# Patient Record
Sex: Male | Born: 1985
Health system: Southern US, Community
[De-identification: ages and names within clinical notes are randomized; demographics above are authoritative.]

## PROBLEM LIST (undated history)

## (undated) DIAGNOSIS — F988 Other specified behavioral and emotional disorders with onset usually occurring in childhood and adolescence: Secondary | ICD-10-CM

## (undated) DIAGNOSIS — B1910 Unspecified viral hepatitis B without hepatic coma: Secondary | ICD-10-CM

## (undated) DIAGNOSIS — H539 Unspecified visual disturbance: Secondary | ICD-10-CM

## (undated) DIAGNOSIS — G709 Myoneural disorder, unspecified: Secondary | ICD-10-CM

## (undated) DIAGNOSIS — G35 Multiple sclerosis: Principal | ICD-10-CM

## (undated) DIAGNOSIS — I639 Cerebral infarction, unspecified: Secondary | ICD-10-CM

## (undated) HISTORY — DX: Unspecified viral hepatitis B without hepatic coma: B19.10

## (undated) HISTORY — DX: Myoneural disorder, unspecified: G70.9

## (undated) HISTORY — DX: Unspecified visual disturbance: H53.9

## (undated) HISTORY — PX: OTHER SURGICAL HISTORY: SHX169

## (undated) HISTORY — DX: Cerebral infarction, unspecified: I63.9

## (undated) HISTORY — DX: Multiple sclerosis: G35

## (undated) HISTORY — DX: Other specified behavioral and emotional disorders with onset usually occurring in childhood and adolescence: F98.8

---
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
## 2004-10-04 ENCOUNTER — Emergency Department (HOSPITAL_COMMUNITY): Admission: EM | Admit: 2004-10-04 | Discharge: 2004-10-04 | Payer: Self-pay | Admitting: Family Medicine

## 2009-03-31 ENCOUNTER — Ambulatory Visit: Payer: Self-pay | Admitting: Family Medicine

## 2018-03-13 ENCOUNTER — Other Ambulatory Visit (HOSPITAL_COMMUNITY): Payer: Self-pay | Admitting: Emergency Medicine

## 2018-03-13 ENCOUNTER — Ambulatory Visit (HOSPITAL_COMMUNITY)
Admission: RE | Admit: 2018-03-13 | Discharge: 2018-03-13 | Disposition: A | Discharge status: Home / Self Care | Payer: 59 | Source: Ambulatory Visit | Attending: Emergency Medicine | Admitting: Emergency Medicine

## 2018-03-13 DIAGNOSIS — D496 Neoplasm of unspecified behavior of brain: Secondary | ICD-10-CM | POA: Insufficient documentation

## 2018-03-13 DIAGNOSIS — G9389 Other specified disorders of brain: Secondary | ICD-10-CM | POA: Insufficient documentation

## 2018-03-13 MED ORDER — GADOBUTROL 1 MMOL/ML IV SOLN
7.0000 mL | Freq: Once | INTRAVENOUS | Status: AC | PRN
  Administered 2018-03-13: 7 mL via INTRAVENOUS

## 2018-03-20 ENCOUNTER — Other Ambulatory Visit: Payer: Self-pay

## 2018-03-20 ENCOUNTER — Ambulatory Visit (INDEPENDENT_AMBULATORY_CARE_PROVIDER_SITE_OTHER): Payer: 59 | Admitting: Neurology

## 2018-03-20 ENCOUNTER — Encounter: Payer: Self-pay | Admitting: Neurology

## 2018-03-20 VITALS — BP 139/92 | HR 88 | Resp 16 | Ht 66.0 in | Wt 134.0 lb

## 2018-03-20 DIAGNOSIS — R9089 Other abnormal findings on diagnostic imaging of central nervous system: Secondary | ICD-10-CM

## 2018-03-20 DIAGNOSIS — R202 Paresthesia of skin: Secondary | ICD-10-CM

## 2018-03-20 DIAGNOSIS — G35 Multiple sclerosis: Secondary | ICD-10-CM | POA: Diagnosis not present

## 2018-03-20 NOTE — Progress Notes (Signed)
Reason for visit: Abnormal MRI brain  Referring physician: Dr. Tyron Carlson is a 32 y.o. male  History of present illness:  Nathaniel Carlson is a 32 year old right-handed white male with a history of decreased visual acuity involving the left eye since childhood.  The patient states that when he was around 32 years of age he had some worsening of his vision and some double vision but this gradually cleared.  In 2017, he had an event of right-sided numbness and slurred speech, and difficulty walking that lasted only about 2 hours then fully cleared.  He never had a full neurologic evaluation for this.  He hit his head at work on 12 March 2018, he underwent a CT scan of the brain which showed a left frontal abnormality.  For this reason, MRI of the brain was done with and without gadolinium enhancement.  This revealed evidence of what was felt to be demyelinating disease with a larger enhancing lesion in the left frontal lobe, a smaller enhancing lesion in the posterior frontal white matter.  The patient has mild deep white matter changes otherwise.  The patient claims that shortly after the CT scan of the head was done, he once again had a 2-hour episode of gait instability and slurring of speech, but this fully cleared.  The patient reports no residual numbness or weakness, he has not had any difficulty controlling the bowels or the bladder, he has not had any new vision changes.  He does report some problems with ADD, he feels that Adderall does help him focus better, without it he has problems with focusing and difficulty with memory.  He is sent to this office for an evaluation.  Past Medical History:  Diagnosis Date  . ADD (attention deficit disorder)   . Vision abnormalities     Past Surgical History:  Procedure Laterality Date  . orif, left arm      Family History  Problem Relation Age of Onset  . Hypertension Mother   . Asthma Mother   . Hypertension Brother      Social history:  reports that he has been smoking. He has never used smokeless tobacco. He reports that he drank alcohol. He reports that he does not use drugs.  Medications:  Prior to Admission medications   Not on File     No Known Allergies  ROS:  Out of a complete 14 system review of symptoms, the patient complains only of the following symptoms, and all other reviewed systems are negative.  Memory loss  Blood pressure (!) 139/92, pulse 88, resp. rate 16, height 5\' 6"  (1.676 m), weight 134 lb (60.8 kg).  Physical Exam  General: The patient is alert and cooperative at the time of the examination.  Eyes: Pupils are equal, round, and reactive to light. Discs are flat bilaterally.  Neck: The neck is supple, no carotid bruits are noted.  Respiratory: The respiratory examination is clear.  Cardiovascular: The cardiovascular examination reveals a regular rate and rhythm, no obvious murmurs or rubs are noted.  Skin: Extremities are without significant edema.  Neurologic Exam  Mental status: The patient is alert and oriented x 3 at the time of the examination. The patient has apparent normal recent and remote memory, with an apparently normal attention span and concentration ability.  Cranial nerves: Facial symmetry is present. There is good sensation of the face to pinprick and soft touch bilaterally. The strength of the facial muscles and the muscles to  head turning and shoulder shrug are normal bilaterally. Speech is well enunciated, no aphasia or dysarthria is noted. Extraocular movements are full. Visual fields are full. The tongue is midline, and the patient has symmetric elevation of the soft palate. No obvious hearing deficits are noted.  Motor: The motor testing reveals 5 over 5 strength of all 4 extremities. Good symmetric motor tone is noted throughout.  Sensory: Sensory testing is intact to pinprick, soft touch, vibration sensation, and position sense on all 4  extremities. No evidence of extinction is noted.  Coordination: Cerebellar testing reveals good finger-nose-finger and heel-to-shin bilaterally.  Gait and station: Gait is normal. Tandem gait is normal. Romberg is negative. No drift is seen.  Reflexes: Deep tendon reflexes are symmetric and normal bilaterally. Toes are downgoing bilaterally.   MRI brain 03/13/18:  IMPRESSION: Multiple bilateral white matter lesions. The largest lesion in the left frontal lobe has surrounding edema and incomplete ring enhancement. Lesions are all consistent with demyelinating disease. Left frontal lobe and left temporoparietal lobe show enhancement and restricted diffusion consistent with active demyelinization. Left frontal lesion consistent with tumefactive demyelinization.  * MRI scan images were reviewed online. I agree with the written report.    Assessment/Plan:  1.  Abnormal MRI brain  2.  Transient gait disturbance, slurred speech  The patient has an MRI of the brain that is consistent with demyelinating disease such as multiple sclerosis.  The patient has a ring-enhancing lesion in the left frontal lobe surrounded by edema with a small enhancing area in the left temporoparietal lobe.  The patient will be sent for blood work today, he will have MRI of the cervical spine.  The patient will be considered for initiation of a disease modifying agent for multiple sclerosis.  I would opt for a more aggressive treatment initially.  The patient clinically appears to have a normal examination.  He will follow-up in about 6 weeks.  Nathaniel Alexanders MD 03/20/2018 3:19 PM  [facility] [address] [place], [place] [address]  Phone [phone] Fax [phone]

## 2018-03-21 ENCOUNTER — Telehealth: Payer: Self-pay | Admitting: Neurology

## 2018-03-21 NOTE — Telephone Encounter (Signed)
[employer] order sent to [facility]. They obtain they auth and will reach out tot he pt to schedule.

## 2018-03-21 NOTE — Telephone Encounter (Signed)
lvm for pt to be aware . I left [facility] phone number of [phone] and to give them a call if he has not heard in the next 2-3 business days.  °

## 2018-03-24 LAB — QUANTIFERON-TB GOLD PLUS
QuantiFERON Nil Value: 0.02 IU/mL
QuantiFERON TB1 Ag Value: 0.02 IU/mL
QuantiFERON TB2 Ag Value: 0.02 IU/mL
QuantiFERON-TB Gold Plus: NEGATIVE

## 2018-03-24 LAB — ENA+DNA/DS+SJORGEN'S
ENA SSB (LA) Ab: 1.3 AI — ABNORMAL HIGH (ref 0.0–0.9)
dsDNA Ab: <1 IU/mL (ref 0–9)

## 2018-03-24 LAB — HEPATITIS C ANTIBODY

## 2018-03-24 LAB — HEPATITIS B SURFACE ANTIBODY,QUALITATIVE: Hep B Surface Ab, Qual: REACTIVE

## 2018-03-24 LAB — RPR: RPR Ser Ql: NONREACTIVE

## 2018-03-24 LAB — VARICELLA ZOSTER ANTIBODY, IGG: VARICELLA: 1207 {index} (ref >165)

## 2018-03-24 LAB — HIV ANTIBODY (ROUTINE TESTING W REFLEX): HIV Screen 4th Generation wRfx: NONREACTIVE

## 2018-03-24 LAB — ANGIOTENSIN CONVERTING ENZYME: Angio Convert Enzyme: 34 U/L (ref 14–82)

## 2018-03-24 LAB — ANA W/REFLEX: Anti Nuclear Antibody(ANA): POSITIVE — AB

## 2018-03-24 LAB — B. BURGDORFI ANTIBODIES

## 2018-03-24 LAB — SEDIMENTATION RATE: Sed Rate: 2 mm/hr (ref 0–15)

## 2018-03-26 ENCOUNTER — Telehealth: Payer: Self-pay | Admitting: *Deleted

## 2018-03-26 DIAGNOSIS — Z0289 Encounter for other administrative examinations: Secondary | ICD-10-CM

## 2018-03-26 NOTE — Telephone Encounter (Signed)
Pt [place] form on [employer].

## 2018-03-26 NOTE — Telephone Encounter (Signed)
[place].  I have some questions regarding FMLA forms and what he needs.Hilton Cork

## 2018-03-31 ENCOUNTER — Telehealth: Payer: Self-pay | Admitting: *Deleted

## 2018-03-31 NOTE — Telephone Encounter (Signed)
JCV ab collected on 03/20/18 is Indeterminate at 0.24.  Inhibition Assay is Positive/fim

## 2018-03-31 NOTE — Telephone Encounter (Signed)
Spoke with pt.  He sts. he is asking for intermittent FMLA to cover f/u appt's, mri's, etc.  Does not need continuous fmla. I will work on this/fim

## 2018-04-09 ENCOUNTER — Ambulatory Visit
Admission: RE | Admit: 2018-04-09 | Discharge: 2018-04-09 | Disposition: A | Discharge status: Home / Self Care | Payer: 59 | Source: Ambulatory Visit | Attending: Neurology | Admitting: Neurology

## 2018-04-09 DIAGNOSIS — R9089 Other abnormal findings on diagnostic imaging of central nervous system: Secondary | ICD-10-CM | POA: Diagnosis not present

## 2018-04-09 DIAGNOSIS — G35 Multiple sclerosis: Secondary | ICD-10-CM

## 2018-04-09 MED ORDER — GADOBENATE DIMEGLUMINE 529 MG/ML IV SOLN
13.0000 mL | Freq: Once | INTRAVENOUS | Status: AC | PRN
  Administered 2018-04-09: 13 mL via INTRAVENOUS

## 2018-04-13 ENCOUNTER — Telehealth: Payer: Self-pay | Admitting: Neurology

## 2018-04-13 NOTE — Telephone Encounter (Signed)
I called the patient.  MRI of the cervical spine is unremarkable.  The patient will need to go on medication, I will get him started on Gilenya.  He will need an ophthalmology evaluation, if he needs referral he will call our office.    MRI cervical 04/09/18:  IMPRESSION: This is a normal MRI of the cervical spine with and without contrast.

## 2018-04-14 ENCOUNTER — Encounter: Payer: Self-pay | Admitting: *Deleted

## 2018-04-14 ENCOUNTER — Other Ambulatory Visit: Payer: Self-pay | Admitting: *Deleted

## 2018-04-14 DIAGNOSIS — Z79899 Other long term (current) drug therapy: Secondary | ICD-10-CM

## 2018-04-14 DIAGNOSIS — G35 Multiple sclerosis: Secondary | ICD-10-CM

## 2018-04-14 NOTE — Telephone Encounter (Signed)
Patient returning your call. Thanks Hinton Dyer

## 2018-04-14 NOTE — Telephone Encounter (Signed)
I spoke with pt. this am and explained Dr. Jannifer Franklin would like to start him on Gilenya for his newly dx. MS. Before he can start Gilenya, we need notes from his most recent eye exam.  He sts. he had one in March, and ins. will only cover yearly exams. He will have his ophthalmologist fax those notes to [place] and I will see if Dr. Jannifer Franklin feels they are sufficient (need to r/o macular edema).  We also need to get an EKG and he needs to sign the Gilenya srf.  He will come into the office at 1315 today to complete these/fim

## 2018-04-14 NOTE — Telephone Encounter (Signed)
[place] and I will ask him to come in to the office asap to sign a Gilenya srf. Can do EKG at that time as well, if Dr. Jannifer Franklin would like to do his fdo in the office/fim

## 2018-04-15 ENCOUNTER — Telehealth: Payer: Self-pay | Admitting: *Deleted

## 2018-04-15 NOTE — Telephone Encounter (Signed)
PA for Gilenya 0.5mg  capsules #30/30 completed by phone with [place] today (phone# [phone]). Dx: RRMS (G35). Tried and faileds: None, although pt. is not able to self inject, so Copaxone, Glatiramer, Glatopa, Avonex, Rebif, Betaseron, Plegridy are contraindicated. PA approved for dates 04/15/18-04/16/19. PA# [id number]. Copy of approval info faxed to Gilenya Go/fim

## 2018-04-16 NOTE — Telephone Encounter (Signed)
Fax received from [facility] with PA information different than received verbally on 04/15/18.  Gilenya PA approved for dates 04/15/18-04/15/19.  Pt. ID: [id number].  Request ID: [id number]

## 2018-04-17 ENCOUNTER — Telehealth: Payer: Self-pay | Admitting: *Deleted

## 2018-04-17 NOTE — Telephone Encounter (Signed)
I called the patient, I left a message, I will call back later. 

## 2018-04-17 NOTE — Telephone Encounter (Signed)
I spoke with pt. to ask him to call [employer] to complete their welcome call; [employer] will not ship me his starter pack of Gilenya until they talk to him. Pt. had questions regarding MS pathology and Gilenya/ the immune system. We had an extended, pleasant conversation and I answered as many questions as I was able. We discussed his MRI report (brain--both active and inactive lesions, and c-spine which was clear so far).  He let me know that he continues to have numbness, mainly right sided, that waxes and wanes. More severe episode last night. We discussed risks and benefits of Gilenya (cardiac, increased risk of skin ca) and the effects on the immune system. We discussed the need for compliance (new fdo required if he misses 2 wks of med). He asked what type of MS he has and we discussed that at this time Dr. Jannifer Franklin feels he has RRMS, but that the type can change over time; every MS is individual and we learn more about the type each person has by observing their sx. over time. We discussed risks of not treating MS due to fear of side effects (new lesions with sx. depending upon where the lesions are). He expressed fears of side effects of Gilenya--sts. he has 2 children whose mother has passed away, and so he is fearful of leaving them orphans. He would like to discuss further with Dr. Jannifer Franklin as well.  Pt. works [phone] and has a lunch break at [employer].  I will see if Dr. Jannifer Franklin can call him tomorrow/fim

## 2018-04-18 NOTE — Telephone Encounter (Signed)
I called patient, left a message, I will call back later. 

## 2018-04-18 NOTE — Telephone Encounter (Signed)
I called and left another message, I will call back later.

## 2018-04-20 NOTE — Telephone Encounter (Signed)
I called the patient. I left a message again, the patient is to call us if needed.

## 2018-04-22 ENCOUNTER — Telehealth: Payer: Self-pay

## 2018-04-22 NOTE — Telephone Encounter (Signed)
RN receive call from Dr. Einar Gip office. [facility] Paganini stated they receive a urgent referral for ophthalmology. Rise Paganini stated they are a cardiology office and do not treat eye disorders.RN stated she can disregard the order it was resent to DR.Groat office. Beverly verbalized understanding.

## 2018-04-22 NOTE — Telephone Encounter (Signed)
Rn call Dr. Katy Fitch office to find if pt was schedule and did they receive the referral. They stated pt is schedule on 04/25/2018 at 1045 for new pt appt.

## 2018-04-23 ENCOUNTER — Telehealth: Payer: Self-pay | Admitting: *Deleted

## 2018-04-23 NOTE — Telephone Encounter (Signed)
LMOM to remind pt. about appt. for his Gilenya FDO on 04/28/18. He is on the schedule for 1230 but should arrive at 8am for first EKG. Please call with any questions/fim

## 2018-04-25 ENCOUNTER — Encounter: Payer: Self-pay | Admitting: Neurology

## 2018-04-28 ENCOUNTER — Encounter: Payer: Self-pay | Admitting: Neurology

## 2018-04-28 ENCOUNTER — Other Ambulatory Visit: Payer: Self-pay

## 2018-04-28 ENCOUNTER — Telehealth: Payer: Self-pay | Admitting: *Deleted

## 2018-04-28 ENCOUNTER — Ambulatory Visit (INDEPENDENT_AMBULATORY_CARE_PROVIDER_SITE_OTHER): Payer: 59 | Admitting: Neurology

## 2018-04-28 DIAGNOSIS — G35 Multiple sclerosis: Secondary | ICD-10-CM

## 2018-04-28 HISTORY — DX: Multiple sclerosis: G35

## 2018-04-28 MED ORDER — AMPHETAMINE-DEXTROAMPHETAMINE 15 MG PO TABS: 15.0000 mg | ORAL_TABLET | Freq: Every day | ORAL | 0 refills | Status: DC

## 2018-04-28 MED ORDER — AMPHETAMINE-DEXTROAMPHET ER 30 MG PO CP24: 30.0000 mg | ORAL_CAPSULE | Freq: Every morning | ORAL | 0 refills | Status: DC

## 2018-04-28 NOTE — Progress Notes (Signed)
The patient came in for a first dose observation for his Gilenya today.  EKG was done before and after the evaluation.  The patient did have a drop in the heart rate into the 60s, but he tolerated the drug quite well, no side effects were noted.  He was sent home to continue the [place] on a daily basis.

## 2018-04-28 NOTE — Telephone Encounter (Signed)
The prescriptions for Adderall were sent in.

## 2018-04-28 NOTE — Progress Notes (Signed)
0910--Pt. presented to office for scheduled Gilenya FDO. EKG done and read by Dr. Anabel Bene 0915-127/77-75-14. Hilton Cork 0920- Gilenya 0.5mg  po given./fim 7253- Dr. Jannifer Franklin with pt./fim 6644-034/74-25-95. Pt. resting quietly in recliner, on phone. No complaints./fim 1022-117/70-68-14/fim 1056-110/68-68-14. Pt. remains alert and oriented x4. Skin warm, dry, pink. No complaints voiced and no obvious distress noted/fim 1125-114/68-66-12. Pulse regular to palpation./fim 1157-123/73-66-14. Eating lunch/fim 1220-123/75-65-12. No obvious distress noted. Pt. sts. he feels fine/fim 1250-113/67-63. Pulse reg. to palpation/fim 1322-108/64-60-14./fim 1350-Up to bathroom. 115/66-93. Denies dizziness/lightheadedness. Skin warm/dry/pink/fim 1435-104/64-59-14/fim 1515-EKG done and read by Dr. Jannifer Franklin.  1520-113/74-55-14. Instructions given to take Gilenya in the am, when he will be up and more active, in order to help keep pulse up. 13 day supply of Gilenya sent home with pt. and pt. instructed that he will receive a call from the specialty pharmacy in the next few business days, to schedule delivery of Gilenya. The SP will not deliver Gilenya until they have talked to pt. Pt. verbalized understanding of same, and will watch for SP's call. Pt. instructed to contact our office if he ever gets down to a 2 wk. supply and doesn't have a delivery coming. He is aware he should contact [employer] as well, if he has not received reg. Gilenya delivery, or if he has change in insurance, and they can arrange for an interim supply. Pt. instructed that if he misses one dose of Gilenya, he should not double up the next dose--just try not to miss future diose. Pt. instructed that if he misses 2 consecutive wks. of Gilenya, FDO must be repeated. He verbalized understanding of same/fim 1535--EKG read by Dr. Jannifer Franklin, and v/o to d/c pt. home received.  Dr. Jannifer Franklin also reviewed d/c instructions, including increased risk of palpations over the  next several days up to 2 wks., with instructions that brief palpitations are ok, anything that persists must be evaluated. Pt. d/c home and is ambulatory out of the office without difficulty or distress/fim 1545 I have spoken with Stacy at [facility] and advised her that pt. completed his FDO without event today, and so Gilenya Rx. can be released to the SP.  Stacy verbalized understanding of same, sts. Gilenya rx. will be triaged to [facility]/fim

## 2018-04-28 NOTE — Telephone Encounter (Signed)
Dr. Jannifer Franklin seeing pt. for MS.  Pt. sts. he previously saw psych for ADD, wonders if Dr. Jannifer Franklin can take over Adderall rx. I confirmed with [facility] in [place] that pt. was taking Adderall ER 30mg  once daily and Adderall IR 30mg , one and 1/2 tablets daily.  Pt. sts. 45mg  of the IR Adderall was too much--sts. he took the Adderall ER 30mg  in the am, and the Adderall IR 30mg  around 2pm./fim

## 2018-04-29 ENCOUNTER — Telehealth: Payer: Self-pay | Admitting: *Deleted

## 2018-04-29 NOTE — Telephone Encounter (Signed)
I have spoken with [employer] (phone# [phone]) and received an override for both ER and IR Adderall.  Good for 1 yr. No approval #--this is just an override so pt. can get both ER and IR Adderall.  I spoke with pt. and let him know he should be able to pick meds up at the pharmacy now/fim

## 2018-05-05 ENCOUNTER — Telehealth: Payer: Self-pay | Admitting: Neurology

## 2018-05-05 NOTE — Telephone Encounter (Signed)
Received a fax from [facility] regarding [place] for Gilenya 0.5. Number provided on fax to return call [phone].  I contacted this number and spoke with a representative (Ayshea). I advised her Mr. Calabretta had his first dose observation on 04/28/2018 completed in our office.  She verbalized understanding and stated she would update information in the patient's account.  MB RN

## 2018-05-05 NOTE — Telephone Encounter (Signed)
Nathaniel Carlson with [facility] calling to get patient's 1st dose of Fingolimod HCl (GILENYA) 0.5 MG CAPS. Please call and discuss.

## 2018-05-05 NOTE — Telephone Encounter (Signed)
I attempted to reach Nathaniel Carlson at number provided [phone]) and was advised the number was not in service.

## 2018-05-05 NOTE — Telephone Encounter (Signed)
I double checked that number with Iris @ [facility] when she called and she said it was correct.Marland Kitchensorry but glad you did get a good number.

## 2018-05-12 NOTE — Addendum Note (Signed)
Encounter addended by: Dyanne Carrel on: 05/12/2018 4:04 PM  Actions taken: Imaging Exam ended, Charge Capture section accepted

## 2018-05-15 ENCOUNTER — Telehealth: Payer: Self-pay | Admitting: Neurology

## 2018-05-15 NOTE — Telephone Encounter (Signed)
This patient was seen by dr.Lundquist form [facility]. No macular abnormalities seen, the patient is on Gilenya.

## 2018-05-19 ENCOUNTER — Ambulatory Visit (INDEPENDENT_AMBULATORY_CARE_PROVIDER_SITE_OTHER): Payer: 59 | Admitting: Neurology

## 2018-05-19 ENCOUNTER — Encounter: Payer: Self-pay | Admitting: Neurology

## 2018-05-19 VITALS — BP 130/82 | HR 80 | Ht 66.0 in | Wt 137.3 lb

## 2018-05-19 DIAGNOSIS — R5382 Chronic fatigue, unspecified: Secondary | ICD-10-CM | POA: Diagnosis not present

## 2018-05-19 DIAGNOSIS — G35 Multiple sclerosis: Secondary | ICD-10-CM

## 2018-05-19 DIAGNOSIS — Z5181 Encounter for therapeutic drug level monitoring: Secondary | ICD-10-CM

## 2018-05-19 NOTE — Progress Notes (Signed)
Reason for visit: Multiple sclerosis  Nathaniel Carlson is an 33 y.o. male  History of present illness:  Nathaniel Carlson is a 33 year old right-handed white male with a history of multiple sclerosis.  The patient has just recently started Gilenya, he has been on this about 2 weeks.  He seems to be tolerating the medication fairly well.  He does report some ongoing problems with fatigue, he is on Adderall for an attention deficit disorder, but he is having more problems with focusing, and he is losing his train of thought.  The patient is having some problems with dryness of his hair and changes in his nails of his feet.  The patient otherwise is doing well, he has no obvious side effects to the Gilenya.  Past Medical History:  Diagnosis Date  . ADD (attention deficit disorder)   . Multiple sclerosis ([place]) 04/28/2018  . Vision abnormalities     Past Surgical History:  Procedure Laterality Date  . orif, left arm      Family History  Problem Relation Age of Onset  . Hypertension Mother   . Asthma Mother   . Hypertension Brother     Social history:  reports that he has been smoking. He has never used smokeless tobacco. He reports previous alcohol use. He reports that he does not use drugs.   No Known Allergies  Medications:  Prior to Admission medications   Medication Sig Start Date End Date Taking? Authorizing Provider  amphetamine-dextroamphetamine (ADDERALL XR) 30 MG 24 hr capsule Take 1 capsule (30 mg total) by mouth every morning. 04/28/18  Yes Kathrynn Ducking, MD  amphetamine-dextroamphetamine (ADDERALL) 15 MG tablet Take 1 tablet by mouth daily after lunch. 04/28/18  Yes Kathrynn Ducking, MD  Fingolimod HCl (GILENYA) 0.5 MG CAPS Take 0.5 mg by mouth daily.   Yes [provider]    ROS:  Out of a complete 14 system review of symptoms, the patient complains only of the following symptoms, and all other reviewed systems are negative.  Memory loss, headache,  numbness Agitation  Blood pressure 130/82, pulse 80, height 5\' 6"  (1.676 m), weight 137 lb 5 oz (62.3 kg).  Physical Exam  General: The patient is alert and cooperative at the time of the examination.  Skin: No significant peripheral edema is noted.   Neurologic Exam  Mental status: The patient is alert and oriented x 3 at the time of the examination. The patient has apparent normal recent and remote memory, with an apparently normal attention span and concentration ability.   Cranial nerves: Facial symmetry is present. Speech is normal, no aphasia or dysarthria is noted. Extraocular movements are full. Visual fields are full.  Motor: The patient has good strength in all 4 extremities.  Sensory examination: Soft touch sensation is symmetric on the face, arms, and legs.  Coordination: The patient has good finger-nose-finger and heel-to-shin bilaterally.  Gait and station: The patient has a normal gait. Tandem gait is very minimally unsteady. Romberg is negative. No drift is seen.  Reflexes: Deep tendon reflexes are symmetric.   Assessment/Plan:  1.  Multiple sclerosis  2.  Attention deficit disorder  The patient is having increased problems with fatigue, further blood work will be done, we will check a thyroid profile.  The patient has had a JC antibody that was positive with a very low titer, 0.24.  The patient will follow-up in 5 months.  Jill Alexanders MD 05/19/2018 2:28 PM  [facility]  [address] [place], [place] [address]  Phone [phone] Fax [phone]

## 2018-05-20 LAB — COMPREHENSIVE METABOLIC PANEL
ALT: 15 IU/L (ref 0–44)
AST: 18 IU/L (ref 0–40)
Albumin/Globulin Ratio: 2.1 (ref 1.2–2.2)
Albumin: 4.5 g/dL (ref 3.5–5.5)
Alkaline Phosphatase: 63 IU/L (ref 39–117)
BUN/Creatinine Ratio: 9 (ref 9–20)
BUN: 9 mg/dL (ref 6–20)
Bilirubin Total: 0.8 mg/dL (ref 0.0–1.2)
CO2: 21 mmol/L (ref 20–29)
Calcium: 9.8 mg/dL (ref 8.7–10.2)
Chloride: 104 mmol/L (ref 96–106)
Creatinine, Ser: 1 mg/dL (ref 0.76–1.27)
GFR calc Af Amer: 113 mL/min/{1.73_m2} (ref >59)
GFR calc non Af Amer: 98 mL/min/{1.73_m2} (ref >59)
Globulin, Total: 2.1 g/dL (ref 1.5–4.5)
Glucose: 88 mg/dL (ref 65–99)
POTASSIUM: 4 mmol/L (ref 3.5–5.2)
Sodium: 140 mmol/L (ref 134–144)
Total Protein: 6.6 g/dL (ref 6.0–8.5)

## 2018-05-20 LAB — CBC WITH DIFFERENTIAL/PLATELET
Basophils Absolute: 0 10*3/uL (ref 0.0–0.2)
Basos: 0 %
EOS (ABSOLUTE): 0.1 10*3/uL (ref 0.0–0.4)
Eos: 2 %
Hematocrit: 43.1 % (ref 37.5–51.0)
Hemoglobin: 15.3 g/dL (ref 13.0–17.7)
Immature Grans (Abs): 0 10*3/uL (ref 0.0–0.1)
Immature Granulocytes: 0 %
Lymphocytes Absolute: 0.4 10*3/uL — ABNORMAL LOW (ref 0.7–3.1)
Lymphs: 8 %
MCH: 31.7 pg (ref 26.6–33.0)
MCHC: 35.5 g/dL (ref 31.5–35.7)
MCV: 89 fL (ref 79–97)
Monocytes Absolute: 0.5 10*3/uL (ref 0.1–0.9)
Monocytes: 9 %
Neutrophils Absolute: 4 10*3/uL (ref 1.4–7.0)
Neutrophils: 81 %
PLATELETS: 241 10*3/uL (ref 150–450)
RBC: 4.83 x10E6/uL (ref 4.14–5.80)
RDW: 12.9 % (ref 11.6–15.4)
WBC: 5 10*3/uL (ref 3.4–10.8)

## 2018-05-20 LAB — TSH: TSH: 0.637 u[IU]/mL (ref 0.450–4.500)

## 2018-06-17 ENCOUNTER — Other Ambulatory Visit: Payer: Self-pay | Admitting: Neurology

## 2018-06-17 MED ORDER — AMPHETAMINE-DEXTROAMPHET ER 30 MG PO CP24: 30.0000 mg | ORAL_CAPSULE | Freq: Every morning | ORAL | 0 refills | Status: DC

## 2018-06-17 MED ORDER — AMPHETAMINE-DEXTROAMPHETAMINE 15 MG PO TABS: 15.0000 mg | ORAL_TABLET | Freq: Every day | ORAL | 0 refills | Status: DC

## 2018-07-24 ENCOUNTER — Other Ambulatory Visit: Payer: Self-pay | Admitting: Neurology

## 2018-07-24 MED ORDER — AMPHETAMINE-DEXTROAMPHETAMINE 15 MG PO TABS: 15.0000 mg | ORAL_TABLET | Freq: Every day | ORAL | 0 refills | Status: DC

## 2018-07-24 MED ORDER — AMPHETAMINE-DEXTROAMPHET ER 30 MG PO CP24: 30.0000 mg | ORAL_CAPSULE | Freq: Every morning | ORAL | 0 refills | Status: DC

## 2018-08-26 MED ORDER — AMPHETAMINE-DEXTROAMPHETAMINE 15 MG PO TABS: 15.0000 mg | ORAL_TABLET | Freq: Every day | ORAL | 0 refills | Status: DC

## 2018-08-26 MED ORDER — AMPHETAMINE-DEXTROAMPHET ER 30 MG PO CP24: 30.0000 mg | ORAL_CAPSULE | Freq: Every morning | ORAL | 0 refills | Status: DC

## 2018-09-25 ENCOUNTER — Other Ambulatory Visit: Payer: Self-pay | Admitting: Neurology

## 2018-09-25 MED ORDER — AMPHETAMINE-DEXTROAMPHET ER 30 MG PO CP24: 30.0000 mg | ORAL_CAPSULE | Freq: Every morning | ORAL | 0 refills | Status: DC

## 2018-09-25 MED ORDER — AMPHETAMINE-DEXTROAMPHETAMINE 15 MG PO TABS: 15.0000 mg | ORAL_TABLET | Freq: Every day | ORAL | 0 refills | Status: DC

## 2018-10-15 ENCOUNTER — Telehealth: Payer: Self-pay | Admitting: *Deleted

## 2018-10-15 NOTE — Telephone Encounter (Signed)
Due to current COVID 19 pandemic, our office is severely reducing in office visits until further notice, in order to minimize the risk to our patients and healthcare providers. Unable to get in contact with the patient to convert their office visit with Judson Roch 10/20/2018 into a doxy.me visit. I left a voicemail asking the patient to return my call. Office number was provided.     If patient calls back please convert their office visit into a doxy.me visit.

## 2018-10-20 ENCOUNTER — Ambulatory Visit: Payer: 59 | Admitting: Neurology

## 2018-10-28 ENCOUNTER — Other Ambulatory Visit: Payer: Self-pay

## 2018-10-29 MED ORDER — AMPHETAMINE-DEXTROAMPHETAMINE 15 MG PO TABS: 15.0000 mg | ORAL_TABLET | Freq: Every day | ORAL | 0 refills | Status: DC

## 2018-10-29 MED ORDER — AMPHETAMINE-DEXTROAMPHET ER 30 MG PO CP24: 30.0000 mg | ORAL_CAPSULE | Freq: Every morning | ORAL | 0 refills | Status: DC

## 2018-11-26 ENCOUNTER — Other Ambulatory Visit: Payer: Self-pay

## 2018-11-26 MED ORDER — AMPHETAMINE-DEXTROAMPHET ER 30 MG PO CP24: 30.0000 mg | ORAL_CAPSULE | Freq: Every morning | ORAL | 0 refills | Status: DC

## 2018-11-26 MED ORDER — AMPHETAMINE-DEXTROAMPHETAMINE 15 MG PO TABS: 15.0000 mg | ORAL_TABLET | Freq: Every day | ORAL | 0 refills | Status: DC

## 2018-12-24 ENCOUNTER — Other Ambulatory Visit: Payer: Self-pay

## 2018-12-24 MED ORDER — AMPHETAMINE-DEXTROAMPHETAMINE 15 MG PO TABS: 15.0000 mg | ORAL_TABLET | Freq: Every day | ORAL | 0 refills | Status: DC

## 2018-12-24 MED ORDER — AMPHETAMINE-DEXTROAMPHET ER 30 MG PO CP24: 30.0000 mg | ORAL_CAPSULE | Freq: Every morning | ORAL | 0 refills | Status: DC

## 2019-02-06 ENCOUNTER — Other Ambulatory Visit: Payer: Self-pay

## 2019-02-09 NOTE — Telephone Encounter (Signed)
[place] Database Verified LR: 12-30-2018 Qty: 30 Pending appointment: No pending appt

## 2019-02-10 MED ORDER — AMPHETAMINE-DEXTROAMPHETAMINE 15 MG PO TABS: 15.0000 mg | ORAL_TABLET | Freq: Every day | ORAL | 0 refills | Status: DC

## 2019-02-10 MED ORDER — AMPHETAMINE-DEXTROAMPHET ER 30 MG PO CP24: 30.0000 mg | ORAL_CAPSULE | Freq: Every morning | ORAL | 0 refills | Status: DC

## 2019-03-02 ENCOUNTER — Other Ambulatory Visit: Payer: Self-pay

## 2019-03-02 NOTE — Telephone Encounter (Signed)
These prescriptions are not due until 10 March 2019.

## 2019-03-05 ENCOUNTER — Other Ambulatory Visit: Payer: Self-pay

## 2019-03-09 NOTE — Telephone Encounter (Signed)
[place] Database Verified LR: 02-10-2019 Qty: 30 Pending appointment: No pending appt

## 2019-03-10 MED ORDER — AMPHETAMINE-DEXTROAMPHET ER 30 MG PO CP24: 30.0000 mg | ORAL_CAPSULE | Freq: Every morning | ORAL | 0 refills | Status: DC

## 2019-03-10 MED ORDER — AMPHETAMINE-DEXTROAMPHETAMINE 15 MG PO TABS: 15.0000 mg | ORAL_TABLET | Freq: Every day | ORAL | 0 refills | Status: DC

## 2019-03-10 NOTE — Telephone Encounter (Signed)
The prescriptions were sent in today.

## 2019-03-16 ENCOUNTER — Other Ambulatory Visit: Payer: Self-pay | Admitting: Neurology

## 2019-04-16 ENCOUNTER — Encounter: Payer: Self-pay | Admitting: *Deleted

## 2019-04-16 ENCOUNTER — Other Ambulatory Visit: Payer: Self-pay

## 2019-04-16 MED ORDER — AMPHETAMINE-DEXTROAMPHETAMINE 15 MG PO TABS: 15.0000 mg | ORAL_TABLET | Freq: Every day | ORAL | 0 refills | Status: DC

## 2019-04-16 MED ORDER — AMPHETAMINE-DEXTROAMPHET ER 30 MG PO CP24: 30.0000 mg | ORAL_CAPSULE | Freq: Every morning | ORAL | 0 refills | Status: DC

## 2019-04-21 ENCOUNTER — Telehealth: Payer: Self-pay | Admitting: Neurology

## 2019-04-21 NOTE — Telephone Encounter (Signed)
PA received from [facility] via fax. I have completed the information and I have place fax in Dr. Jannifer Franklin office to sign. Once completed we will fax.

## 2019-04-21 NOTE — Telephone Encounter (Signed)
Pt called and LVM stating that he called to fill his GILENYA 0.5 MG CAPS and the pharmacy is informing him that the office needs to get a P.A. and call them with it and also with insurance information that they are needing. Please advise.

## 2019-04-21 NOTE — Telephone Encounter (Signed)
PA for Gilenya has been faxed to # [phone], confirmation has been received. Pt notified via my chart PA has been submitted.

## 2019-04-23 NOTE — Telephone Encounter (Signed)
PA for Gilenya 0.5 mg has been approved. Effective 04/22/2019- 04/21/2020. Request ID [id number]. Pt notified of approval via my chart.

## 2019-05-20 ENCOUNTER — Other Ambulatory Visit: Payer: Self-pay | Admitting: Neurology

## 2019-05-21 ENCOUNTER — Encounter: Payer: Self-pay | Admitting: Neurology

## 2019-05-21 MED ORDER — AMPHETAMINE-DEXTROAMPHETAMINE 15 MG PO TABS: 15.0000 mg | ORAL_TABLET | Freq: Every day | ORAL | 0 refills | Status: DC

## 2019-05-21 MED ORDER — AMPHETAMINE-DEXTROAMPHET ER 30 MG PO CP24: 30.0000 mg | ORAL_CAPSULE | Freq: Every morning | ORAL | 0 refills | Status: DC

## 2019-06-23 ENCOUNTER — Other Ambulatory Visit: Payer: Self-pay | Admitting: Neurology

## 2019-06-23 MED ORDER — AMPHETAMINE-DEXTROAMPHET ER 30 MG PO CP24: 30.0000 mg | ORAL_CAPSULE | Freq: Every morning | ORAL | 0 refills | Status: DC

## 2019-06-23 MED ORDER — AMPHETAMINE-DEXTROAMPHETAMINE 15 MG PO TABS: 15.0000 mg | ORAL_TABLET | Freq: Every day | ORAL | 0 refills | Status: DC

## 2019-07-13 ENCOUNTER — Other Ambulatory Visit: Payer: Self-pay | Admitting: Neurology

## 2019-07-13 MED ORDER — AMPHETAMINE-DEXTROAMPHETAMINE 15 MG PO TABS: 15.0000 mg | ORAL_TABLET | Freq: Every day | ORAL | 0 refills | Status: DC

## 2019-07-13 MED ORDER — AMPHETAMINE-DEXTROAMPHET ER 30 MG PO CP24: 30.0000 mg | ORAL_CAPSULE | Freq: Every morning | ORAL | 0 refills | Status: DC

## 2019-09-21 ENCOUNTER — Other Ambulatory Visit: Payer: Self-pay | Admitting: Neurology

## 2019-10-01 ENCOUNTER — Other Ambulatory Visit: Payer: Self-pay | Admitting: Neurology

## 2019-10-01 MED ORDER — AMPHETAMINE-DEXTROAMPHETAMINE 15 MG PO TABS: 15.0000 mg | ORAL_TABLET | Freq: Every day | ORAL | 0 refills | Status: DC

## 2019-10-01 MED ORDER — AMPHETAMINE-DEXTROAMPHET ER 30 MG PO CP24: 30.0000 mg | ORAL_CAPSULE | Freq: Every morning | ORAL | 0 refills | Status: DC

## 2019-10-13 IMAGING — MR MR HEAD WO/W CM
12 of 16 series · 25 of 48 positions shown · IV contrast (agent unspecified)
Comparison: None.

CLINICAL DATA: Recent minor head injury. Abnormal CT. Possible
brain tumor.

EXAM:
MRI HEAD WITHOUT AND WITH CONTRAST
TECHNIQUE: Multiplanar, multiecho pulse sequences of the brain and surrounding
structures were obtained without and with intravenous contrast.
CONTRAST:  7 mL Gadovist IV

[Series 3: DWI · axial · 3.0mm · 1.09mm/px · z∈[-61,+74]mm · 3 of 94 slices shown (1 of 4)]
[im 1/94]
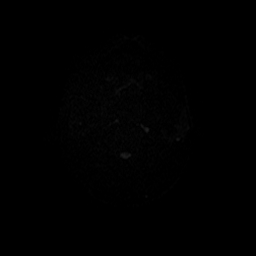
[im 47/94]
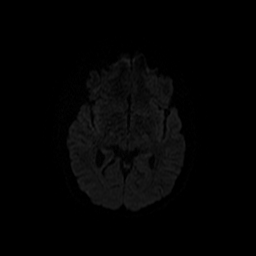
[im 94/94]
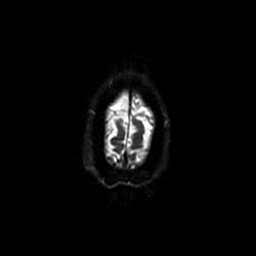

[Series 4: FLAIR · axial · 3.0mm · 0.47mm/px · 1 of 24 slices shown (1 of 2)]
[im 1/24]
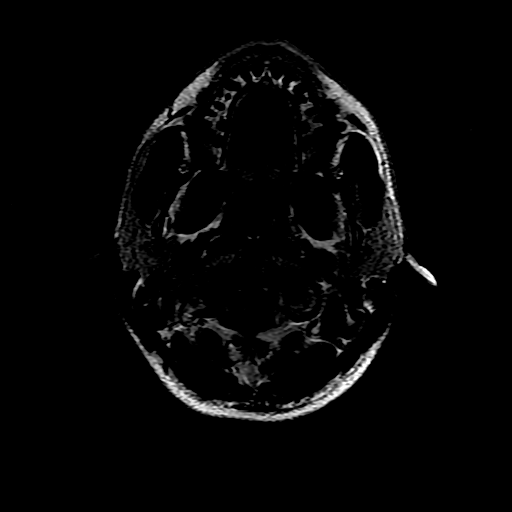

[Series 5: T2 · axial · 5.0mm · 0.47mm/px · 1 of 24 slices shown]
[im 1/24]
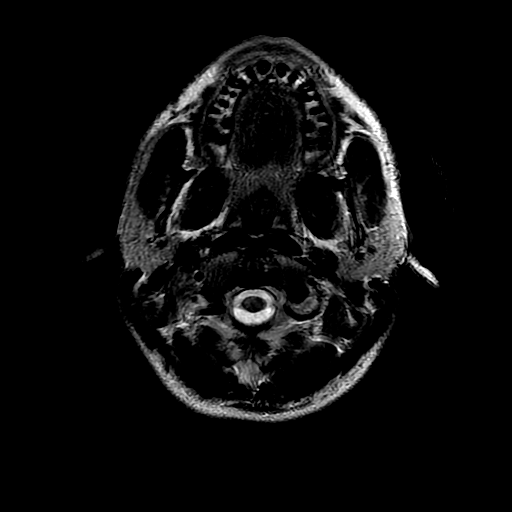

[Series 6: DWI · coronal · 5.0mm · 1.09mm/px · 3 of 74 slices shown (2 of 4)]
[im 1/74]
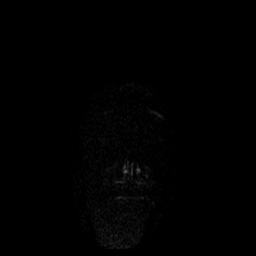
[im 37/74]
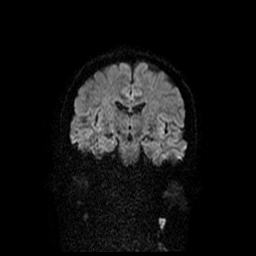
[im 74/74]
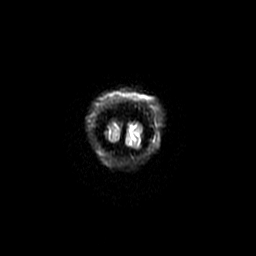

[Series 7: ax mpgr · axial · 5.0mm · 0.47mm/px · 1 of 24 slices shown]
[im 1/24]
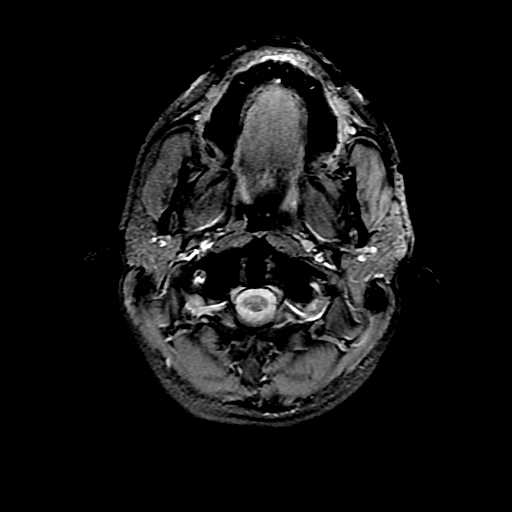

[Series 8: T1 · sagittal · 5.0mm · 0.47mm/px · 1 of 23 slices shown]
[im 1/23]
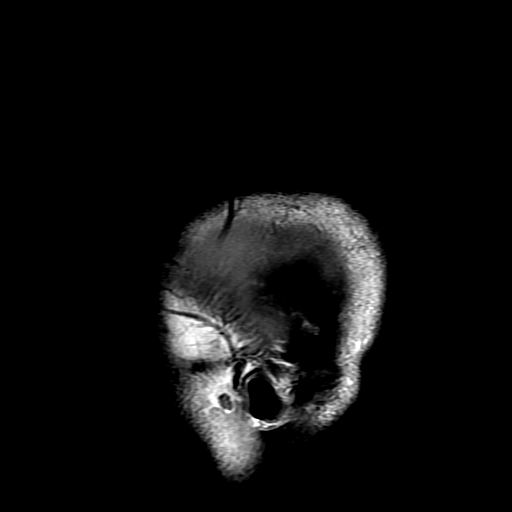

[Series 10: T2 post-contrast · coronal · 5.0mm · 0.39mm/px · 1 of 28 slices shown]
[im 1/28]
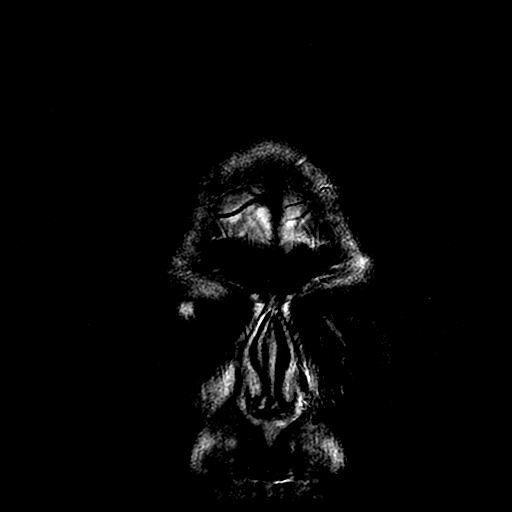

[Series 12: T1 post-contrast · coronal · 5.0mm · 0.39mm/px · 1 of 28 slices shown (1 of 2)]
[im 1/28]
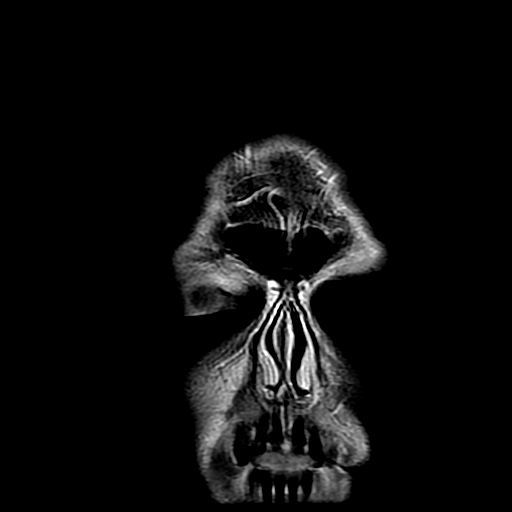

[Series 13: T1 post-contrast · sagittal · 5.0mm · 0.47mm/px · 1 of 23 slices shown (2 of 2)]
[im 1/23]
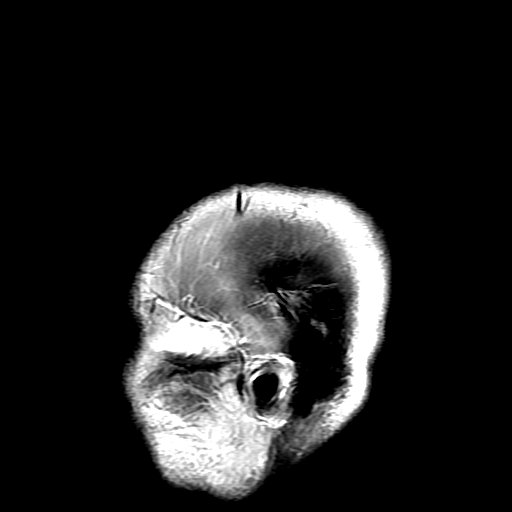

[Series 14: FLAIR · sagittal · 1.2mm · 0.49mm/px · 9 of 240 slices shown (2 of 2)]
[im 1/240]
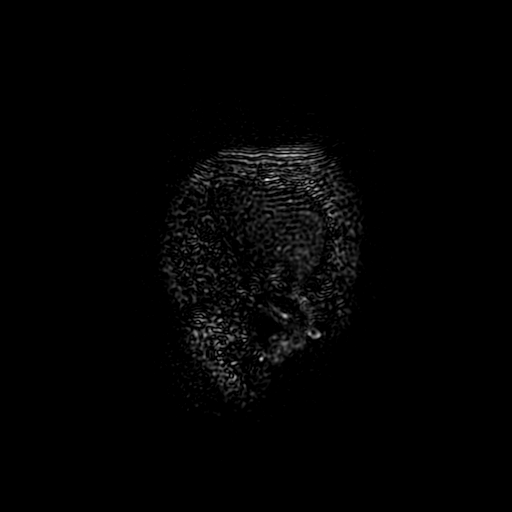
[im 30/240]
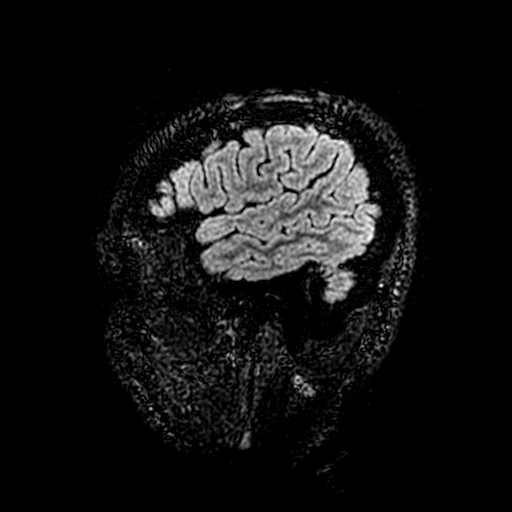
[im 60/240]
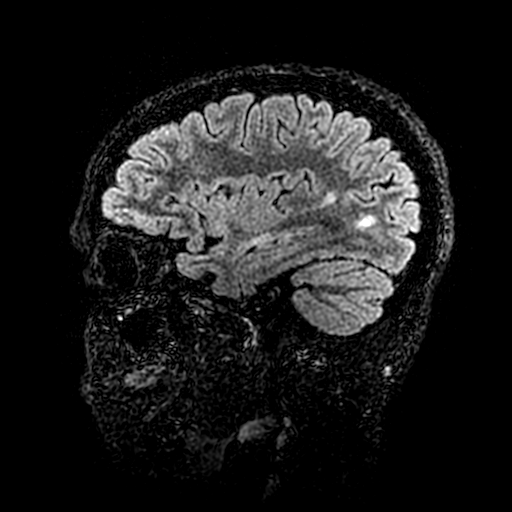
[im 90/240]
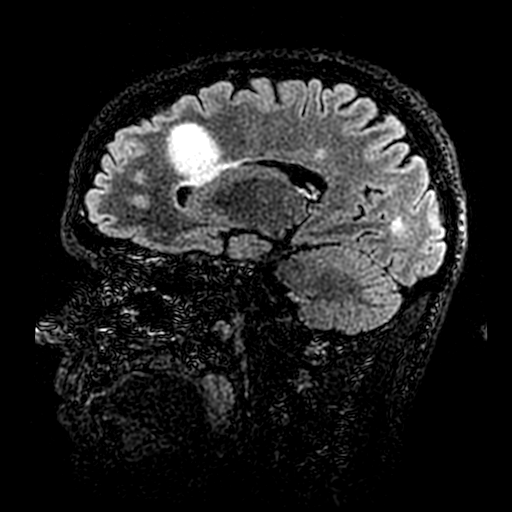
[im 120/240]
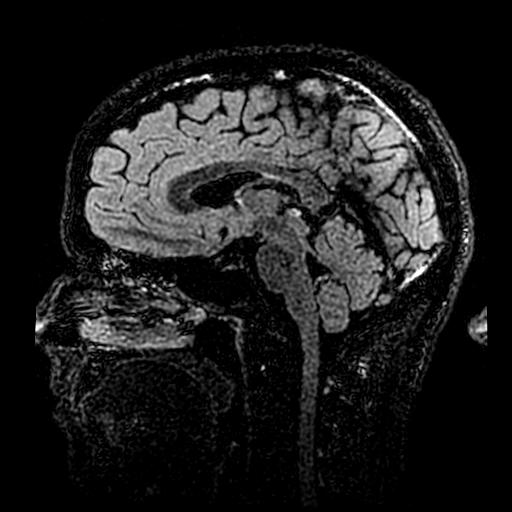
[im 150/240]
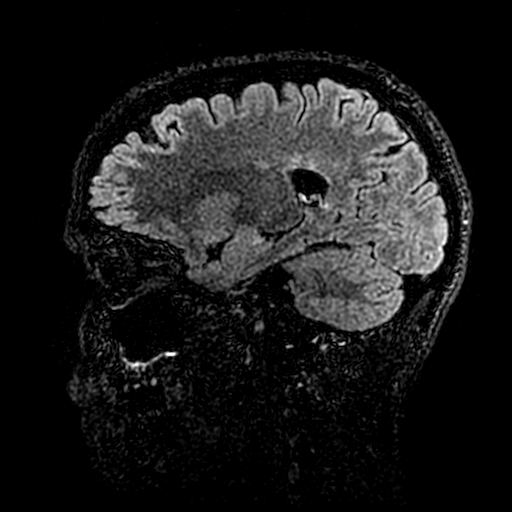
[im 180/240]
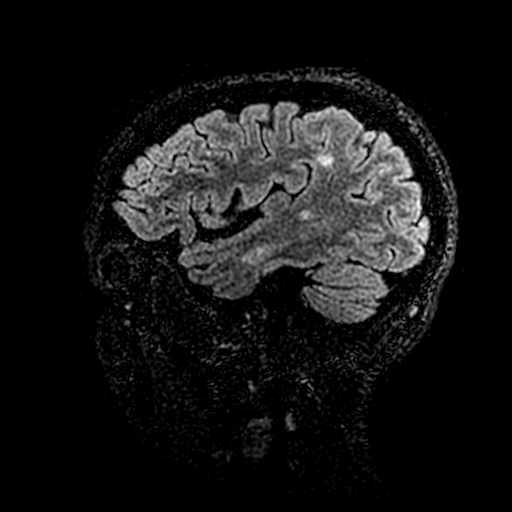
[im 210/240]
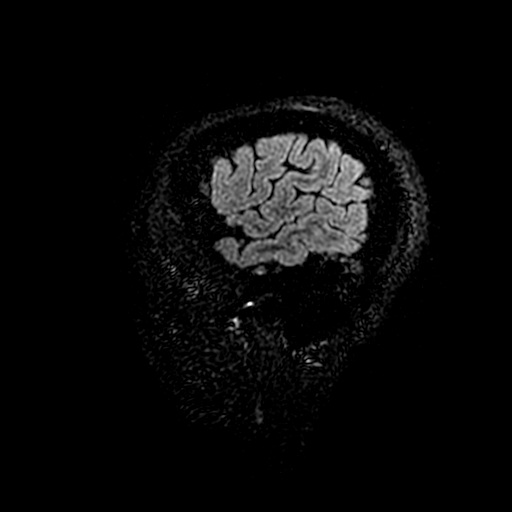
[im 240/240]
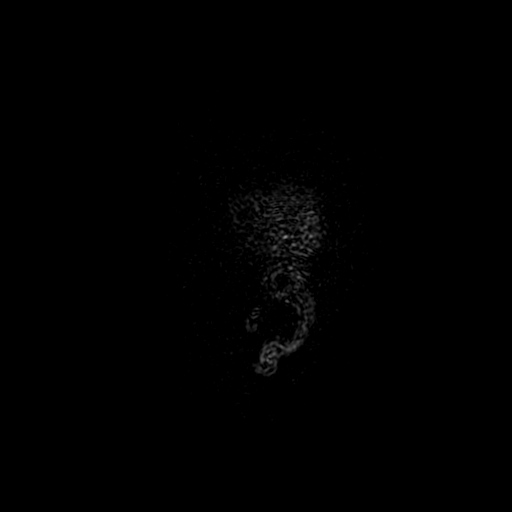

[Series 300: DWI · axial · 3.0mm · 1.09mm/px · z∈[-61,+74]mm · 2 of 47 slices shown (3 of 4)]
[im 1/47]
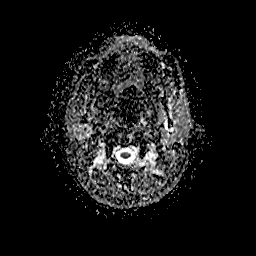
[im 47/47]
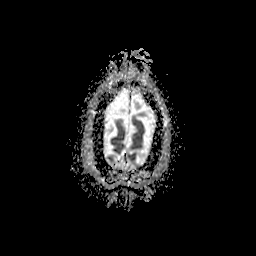

[Series 600: DWI · coronal · 5.0mm · 1.09mm/px · 1 of 37 slices shown (4 of 4)]
[im 1/37]
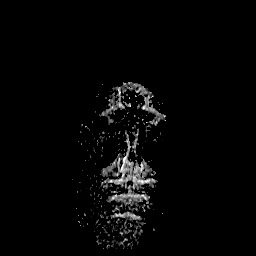

[25 of 48 positions shown; findings below may reference images not displayed]

FINDINGS: Brain: Multiple white matter lesions are present. The largest lesion
in the left frontal lobe has central hyperintensity and surrounding
edema. The central portion measures 10 mm and has incomplete rim
enhancement. Additional lesions in the periventricular white matter
are perpendicular to the corpus callosum and consistent with
demyelinating disease. Slight enhancement and restricted diffusion
of white matter lesion in the left temporoparietal lobe. Moderate
plaque burden. Brainstem and posterior fossa normal

Ventricle size normal. No midline shift. No acute infarct or
hemorrhage.

Vascular: Normal arterial flow void

Skull and upper cervical spine: Negative

Sinuses/Orbits: Negative

Other: None
IMPRESSION: Multiple bilateral white matter lesions. The largest lesion in the
left frontal lobe has surrounding edema and incomplete ring
enhancement. Lesions are all consistent with demyelinating disease.
Left frontal lobe and left temporoparietal lobe show enhancement and
restricted diffusion consistent with active demyelinization. Left
frontal lesion consistent with tumefactive demyelinization.

These results were called by telephone at the time of interpretation
on 03/13/2018 at [DATE] to Dr. NOMASIBULELE MOATSHE , who verbally
acknowledged these results.

## 2019-11-05 ENCOUNTER — Other Ambulatory Visit: Payer: Self-pay | Admitting: Neurology

## 2019-11-05 MED ORDER — AMPHETAMINE-DEXTROAMPHETAMINE 15 MG PO TABS: 15.0000 mg | ORAL_TABLET | Freq: Every day | ORAL | 0 refills | Status: DC

## 2019-11-05 MED ORDER — AMPHETAMINE-DEXTROAMPHET ER 30 MG PO CP24: 30.0000 mg | ORAL_CAPSULE | Freq: Every morning | ORAL | 0 refills | Status: DC

## 2021-11-29 DIAGNOSIS — F900 Attention-deficit hyperactivity disorder, predominantly inattentive type: Secondary | ICD-10-CM | POA: Diagnosis not present

## 2022-01-10 DIAGNOSIS — F900 Attention-deficit hyperactivity disorder, predominantly inattentive type: Secondary | ICD-10-CM | POA: Diagnosis not present

## 2022-04-03 DIAGNOSIS — S81812A Laceration without foreign body, left lower leg, initial encounter: Secondary | ICD-10-CM | POA: Diagnosis not present

## 2022-04-17 DIAGNOSIS — S81812D Laceration without foreign body, left lower leg, subsequent encounter: Secondary | ICD-10-CM | POA: Diagnosis not present

## 2022-04-17 DIAGNOSIS — M79662 Pain in left lower leg: Secondary | ICD-10-CM | POA: Diagnosis not present

## 2022-07-06 DIAGNOSIS — F900 Attention-deficit hyperactivity disorder, predominantly inattentive type: Secondary | ICD-10-CM | POA: Diagnosis not present

## 2022-08-21 DIAGNOSIS — G35 Multiple sclerosis: Secondary | ICD-10-CM | POA: Diagnosis not present

## 2022-08-28 ENCOUNTER — Telehealth: Payer: Self-pay

## 2022-08-28 ENCOUNTER — Encounter: Payer: Self-pay | Admitting: Neurology

## 2022-08-28 ENCOUNTER — Other Ambulatory Visit: Payer: Self-pay | Admitting: Family Medicine

## 2022-08-28 DIAGNOSIS — G35 Multiple sclerosis: Secondary | ICD-10-CM

## 2022-08-28 NOTE — Telephone Encounter (Signed)
Care everywhere search 

## 2022-09-07 ENCOUNTER — Ambulatory Visit
Admission: RE | Admit: 2022-09-07 | Discharge: 2022-09-07 | Disposition: A | Discharge status: Home / Self Care | Payer: BC Managed Care – PPO | Source: Ambulatory Visit | Attending: Family Medicine | Admitting: Family Medicine

## 2022-09-07 DIAGNOSIS — Z0389 Encounter for observation for other suspected diseases and conditions ruled out: Secondary | ICD-10-CM | POA: Diagnosis not present

## 2022-09-07 DIAGNOSIS — G35 Multiple sclerosis: Secondary | ICD-10-CM

## 2022-09-07 MED ORDER — GADOPICLENOL 0.5 MMOL/ML IV SOLN
6.0000 mL | Freq: Once | INTRAVENOUS | Status: AC | PRN
  Administered 2022-09-07: 6 mL via INTRAVENOUS

## 2022-09-24 NOTE — Progress Notes (Unsigned)
[facility]  PATIENT: Nathaniel Carlson DOB: [birth year]  REFERRING DOCTOR OR PCP: Lucile Crater, NP SOURCE: Patient, notes from primary care, notes from neurology, imaging and lab reports, MRI images personally reviewed.  _________________________________   HISTORICAL  CHIEF COMPLAINT:  Chief Complaint  Patient presents with   New Patient (Initial Visit)    Pt in room 11. New patient former Dr. Anne Hahn 2020 MS. Pt had MS relapse a couple weeks ago. Pt reports he was working on sisters car and speech became slurred. Pt said has extreme fatigue, strength in hands are weak, pt said forgetting things more often. Pt said he doesn't feel Adderall  is helping him.    HISTORY OF PRESENT ILLNESS:  I had the pleasure to see your patient, Nathaniel Carlson, at the MS Center at the [facility] at [facility] for a neurologic consultation regarding his multiple sclerosis.  He is a 37 year old man who was diagnosed with MS in 2019 who presented to the emergency room March 12, 2018 with a head injury.  A CT scan of the head showed a left frontal abnormal focus and he had an MRI of the brain with and without contrast.  It was consistent with multiple sclerosis.  The left frontal lobe focus had a tumefactive enhancing appearance.  He was referred to Dr. Johnnette Gourd Who saw him shortly after the emergency room visit.  He was placed on Gilenya.  He was lost to neurologic follow-up after 2020.   He took Gilenya for about one year but was having numbness so he stopped.  In retrospect, he has some other symptoms before his diagnosis in 2019..  At age 26 he had diplopia and visual changes that gradually improved over a few weeks.  In 2017 he had an episode of right-sided numbness and slurred speech with ataxia that lasted only a few hours.  He was doing well with his medication until early April 2024.   He was working under a car when he had the onset of slurred speech, hand weakness  and clumsiness.   Symptoms were significant and stable for the first 4 or 5 days and then he improved a little but not to baseline.   His fatigue is much worse since then and has not improved.   He had a repeat MRI of the brain on 09/07/2022 that showed significant progression with many more T2/FLAIR hyperintense foci in the hemispheres compared to the 2020 MRI.  1 large focus in the right frontal lobe enhanced.  2 other smaller enhancing foci are noted in the right frontal lobe also enhanced.  Currently, gait is a little off balanced and this worsens as the day goes on.  He is stumbling at the end of the day.    He feels weak in his hands throughout the day and his legs feel weaker late in the day.    He has mild left arm numbness but no painful dysesthesias.  Bladder function is fine but he has had ED since the onset of symptoms 08/2022   Vision is stable.  He wears glasses.  He has extreme fatigue that started with the other symptoms April 2024.   He is sleeping well   He has noted auditory processing and reduced word finding difficulty, much worse since April though present to a mild degree before that date.   Mood is doing well   Imaging: MRI of the brain 03/13/2018 showed some scattered T2/FLAIR hypertense foci in the periventricular, juxtacortical  and deep white matter of the cerebral hemispheres.  1 focus in the left frontal lobe is acute with enhancement (tumefactive appearance).  Another small focus that enhanced results are noted.  MRI of the cervical spine 04/09/2018 showed a possible subtle focus at the cervicomedullary junction.  The spinal cord is otherwise normal.  No significant degenerative changes.  MRI of the brain 09/07/2022 shows multiple T2/FLAIR hyperintense foci in the periventricular, juxtacortical and deep white matter.  One of the foci in the right frontal lobe enhances after contrast administration consistent with acute demyelination.  There is a smaller focus nearby in the  right frontal lobe that also has subtle enhancement.      REVIEW OF SYSTEMS: Constitutional: No fevers, chills, sweats, or change in appetite Eyes: No visual changes, double vision, eye pain Ear, nose and throat: No hearing loss, ear pain, nasal congestion, sore throat Cardiovascular: No chest pain, palpitations Respiratory:  No shortness of breath at rest or with exertion.   No wheezes GastrointestinaI: No nausea, vomiting, diarrhea, abdominal pain, fecal incontinence Genitourinary:  No dysuria, urinary retention or frequency.  No nocturia. Musculoskeletal:  No neck pain, back pain Integumentary: No rash, pruritus, skin lesions Neurological: as above Psychiatric: No depression at this time.  No anxiety Endocrine: No palpitations, diaphoresis, change in appetite, change in weigh or increased thirst Hematologic/Lymphatic:  No anemia, purpura, petechiae. Allergic/Immunologic: No itchy/runny eyes, nasal congestion, recent allergic reactions, rashes  ALLERGIES: Allergies  Allergen Reactions   Morphine     Pt said he grandfather passed away from morphine at the hospital. Pt has not never had morphine.     HOME MEDICATIONS:  Current Outpatient Medications:    amphetamine-dextroamphetamine (ADDERALL XR) 30 MG 24 hr capsule, Take 1 capsule (30 mg total) by mouth every morning., Disp: 30 capsule, Rfl: 0   amphetamine-dextroamphetamine (ADDERALL) 15 MG tablet, Take 1 tablet by mouth daily after lunch. (Patient taking differently: Take 30 mg by mouth daily after lunch.), Disp: 30 tablet, Rfl: 0   predniSONE (DELTASONE) 50 MG tablet, 12 pills (600 mg) po qd x 3 days (high dose steroid for MS exacerbation), Disp: 36 tablet, Rfl: 0   sildenafil (VIAGRA) 100 MG tablet, Take 1/2 to 1 pill po qd prn., Disp: 30 tablet, Rfl: 3   GILENYA 0.5 MG CAPS, TAKE 1 CAPSULE ONCE A DAY (Patient not taking: Reported on 09/27/2022), Disp: 30 capsule, Rfl: 3  PAST MEDICAL HISTORY: Past Medical History:  Diagnosis  Date   ADD (attention deficit disorder)    Multiple sclerosis (HCC) 04/28/2018   Vision abnormalities     PAST SURGICAL HISTORY: Past Surgical History:  Procedure Laterality Date   orif, left arm      FAMILY HISTORY: Family History  Problem Relation Age of Onset   Hypertension Mother    Asthma Mother    Hypertension Brother     SOCIAL HISTORY: Social History   Socioeconomic History   Marital status: Unknown    Spouse name: Not on file   Number of children: 3   Years of education: Not on file   Highest education level: Not on file  Occupational History   Not on file  Tobacco Use   Smoking status: Every Day    Years: 17    Types: Cigarettes   Smokeless tobacco: Never   Tobacco comments:    4 cigarettes per day  Vaping Use   Vaping Use: Never used  Substance and Sexual Activity   Alcohol use: Not Currently  Drug use: Never   Sexual activity: Not on file  Other Topics Concern   Not on file  Social History Narrative   ** Merged History Encounter ** ** Merged History Encounter **          Right handed    Wear glasses    Drinks one soda per day, drinks sweet    Social Determinants of Health   Financial Resource Strain: Not on file  Food Insecurity: Not on file  Transportation Needs: Not on file  Physical Activity: Not on file  Stress: Not on file  Social Connections: Not on file  Intimate Partner Violence: Not on file       PHYSICAL EXAM  Vitals:   09/27/22 1315  BP: 119/79  Pulse: 94  Weight: 163 lb (73.9 kg)  Height: 5\' 6"  (1.676 m)    Body mass index is 26.31 kg/m.  Vision Screening   Right eye Left eye Both eyes  Without correction     With correction 20/30 20/30 20/20     General: The patient is well-developed and well-nourished and in no acute distress  HEENT:  Head is [place]/AT.  Sclera are anicteric.  Funduscopic exam shows normal optic discs and retinal vessels.  Neck: No carotid bruits are noted.  The neck is  nontender.  Cardiovascular: The heart has a regular rate and rhythm with a normal S1 and S2. There were no murmurs, gallops or rubs.    Skin: Extremities are without rash or  edema.  Musculoskeletal:  Back is nontender  Neurologic Exam  Mental status: The patient is alert and oriented x 3 at the time of the examination. The patient has apparent normal recent and remote memory, with an apparently normal attention span and concentration ability.   Speech is normal.  Cranial nerves: Extraocular movements are full. Pupils are equal, round, and reactive to light and accomodation.,  Vision was symmetric.  Visual fields are full.  Facial symmetry is present. There is good facial sensation to soft touch bilaterally.Facial strength is normal.  Trapezius and sternocleidomastoid strength is normal.  He had very mild dysarthria the tongue is midline, and the patient has symmetric elevation of the soft palate. No obvious hearing deficits are noted.  Motor:  Muscle bulk is normal.   Tone is normal. Strength is  5 / 5 in all 4 extremities except 4+/5 strength in the interossei muscles of the hands.   Sensory: Sensory testing is intact to pinprick, soft touch and vibration sensation in all 4 extremities.  Coordination: Cerebellar testing reveals good finger-nose-finger and slightly reduced heel-to-shin bilaterally.  Gait and station: Station is normal.   The gait is slightly wide but tandem gait is poor Romberg is negative.   Reflexes: Deep tendon reflexes are symmetric and normal in the arms but increase in the legs with crossed abductors at the knees.  No ankle clonus noted..   Plantar responses are flexor.    DIAGNOSTIC DATA (LABS, IMAGING, TESTING) - I reviewed patient records, labs, notes, testing and imaging myself where available.  Lab Results  Component Value Date   WBC 5.0 05/19/2018   HGB 15.3 05/19/2018   HCT 43.1 05/19/2018   MCV 89 05/19/2018   PLT 241 05/19/2018      Component  Value Date/Time   NA 140 05/19/2018 1449   K 4.0 05/19/2018 1449   CL 104 05/19/2018 1449   CO2 21 05/19/2018 1449   GLUCOSE 88 05/19/2018 1449   BUN 9 05/19/2018 1449  CREATININE 1.00 05/19/2018 1449   CALCIUM 9.8 05/19/2018 1449   PROT 6.6 05/19/2018 1449   ALBUMIN 4.5 05/19/2018 1449   AST 18 05/19/2018 1449   ALT 15 05/19/2018 1449   ALKPHOS 63 05/19/2018 1449   BILITOT 0.8 05/19/2018 1449   GFRNONAA 98 05/19/2018 1449   GFRAA 113 05/19/2018 1449    Lab Results  Component Value Date   TSH 0.637 05/19/2018       ASSESSMENT AND PLAN  Multiple sclerosis (HCC) - Plan: Hepatitis B surface antigen, HIV Antibody (routine testing w rflx), IgG, IgA, IgM, QuantiFERON-TB Gold Plus, Stratify JCV Antibody Test ([facility]), Hepatitis B core antibody, total, Hepatitis B surface antibody,qualitative, Comprehensive metabolic panel, CBC with Differential/Platelet  High risk medication use - Plan: Hepatitis B surface antigen, HIV Antibody (routine testing w rflx), IgG, IgA, IgM, QuantiFERON-TB Gold Plus, Stratify JCV Antibody Test ([facility]), Hepatitis B core antibody, total, Hepatitis B surface antibody,qualitative, Comprehensive metabolic panel, CBC with Differential/Platelet  Gait disturbance  Numbness  Slurred speech  Other male erectile dysfunction   In summary, Mr. Juanito Doom is a 37 year old man with a history of multiple sclerosis diagnosed in 2019 though symptoms began around 2008 between his MRI in 2019 and the MRI last month, the number of T2/FLAIR hypertense foci doubled and he has several new enhancing lesions consistent with acute demyelination.  His exam shows mild reduced gait.  I believe that his gait issues and ED are due to a new spinal plaque that was not covered on the field-of-view  We discussed various treatment options.  Due to his aggressive progression over the past few years and multiple enhancing lesions on his current MRI he needs to go on a highly effective disease  modifying therapy.  I discussed anti-CD20 agent (Briumvi and Ocrevus) and Tysabri.  We will check blood work to see if he is a candidate for these medications and he will give this further thought because of the recent exacerbation and enhancing lesions on the MRI, I will have him do 1 day of 1 g IV Solu-Medrol in the office and we will follow this up with 3 days of high-dose prednisone pills (600 mg a day).  The ED is new and likely due to a spinal plaque from MS.  I will send in a prescription for Viagra.  Hopefully his fatigue will improve after the steroid will want to get on a disease modifying therapy.  If it does not, consider adding modafinil.  He will return to see me in 6 months for regular visit though I will call him with the results of the lab work next week so that we can decide what medication to initiate.  He should call us if he has any new or worsening neurologic symptoms.  Jonathan Corpus A. Epimenio Foot, MD, [facility] 09/27/2022, 8:09 PM Certified in Neurology, Clinical Neurophysiology, Sleep Medicine and Neuroimaging  [facility] [address], [address] [place], [place] [address] [phone]

## 2022-09-25 DIAGNOSIS — G35 Multiple sclerosis: Secondary | ICD-10-CM | POA: Diagnosis not present

## 2022-09-27 ENCOUNTER — Telehealth: Payer: Self-pay | Admitting: *Deleted

## 2022-09-27 ENCOUNTER — Encounter: Payer: Self-pay | Admitting: Neurology

## 2022-09-27 ENCOUNTER — Ambulatory Visit: Payer: BC Managed Care – PPO | Admitting: Neurology

## 2022-09-27 VITALS — BP 119/79 | HR 94 | Ht 66.0 in | Wt 163.0 lb

## 2022-09-27 DIAGNOSIS — R269 Unspecified abnormalities of gait and mobility: Secondary | ICD-10-CM

## 2022-09-27 DIAGNOSIS — G35 Multiple sclerosis: Secondary | ICD-10-CM | POA: Diagnosis not present

## 2022-09-27 DIAGNOSIS — Z79899 Other long term (current) drug therapy: Secondary | ICD-10-CM

## 2022-09-27 DIAGNOSIS — R200 Anesthesia of skin: Secondary | ICD-10-CM | POA: Diagnosis not present

## 2022-09-27 DIAGNOSIS — R4781 Slurred speech: Secondary | ICD-10-CM

## 2022-09-27 DIAGNOSIS — N528 Other male erectile dysfunction: Secondary | ICD-10-CM

## 2022-09-27 MED ORDER — PREDNISONE 50 MG PO TABS: ORAL_TABLET | ORAL | 0 refills | Status: DC

## 2022-09-27 MED ORDER — SILDENAFIL CITRATE 100 MG PO TABS
ORAL_TABLET | ORAL | 3 refills | Status: DC
Start: 2022-09-27 — End: 2023-08-22

## 2022-09-27 NOTE — Telephone Encounter (Signed)
Placed JCV lab in [facility] lock box for routine lab pick up. Results pending. 

## 2022-09-28 ENCOUNTER — Encounter: Payer: Self-pay | Admitting: Neurology

## 2022-09-28 LAB — QUANTIFERON-TB GOLD PLUS

## 2022-09-28 LAB — IGG, IGA, IGM: IgG (Immunoglobin G), Serum: 903 mg/dL (ref 603–1613)

## 2022-09-28 LAB — CBC WITH DIFFERENTIAL/PLATELET
EOS (ABSOLUTE): 0.1 10*3/uL (ref 0.0–0.4)
Eos: 2 %
Hemoglobin: 17.7 g/dL (ref 13.0–17.7)
MCH: 31.1 pg (ref 26.6–33.0)
MCV: 89 fL (ref 79–97)
Monocytes Absolute: 0.4 10*3/uL (ref 0.1–0.9)
Monocytes: 6 %
Platelets: 259 10*3/uL (ref 150–450)
RBC: 5.69 x10E6/uL (ref 4.14–5.80)
WBC: 5.9 10*3/uL (ref 3.4–10.8)

## 2022-09-28 LAB — COMPREHENSIVE METABOLIC PANEL
ALT: 18 IU/L (ref 0–44)
AST: 17 IU/L (ref 0–40)
Albumin/Globulin Ratio: 2.3 — ABNORMAL HIGH (ref 1.2–2.2)
Sodium: 141 mmol/L (ref 134–144)
Total Protein: 7.2 g/dL (ref 6.0–8.5)

## 2022-09-28 LAB — HIV ANTIBODY (ROUTINE TESTING W REFLEX): HIV Screen 4th Generation wRfx: NONREACTIVE

## 2022-09-30 LAB — QUANTIFERON-TB GOLD PLUS
QuantiFERON Nil Value: 0.01 IU/mL
QuantiFERON TB2 Ag Value: 0 IU/mL

## 2022-09-30 LAB — CBC WITH DIFFERENTIAL/PLATELET
Basophils Absolute: 0 10*3/uL (ref 0.0–0.2)
Basos: 1 %
Hematocrit: 50.4 % (ref 37.5–51.0)
Immature Grans (Abs): 0 10*3/uL (ref 0.0–0.1)
Immature Granulocytes: 0 %
Lymphocytes Absolute: 1.4 10*3/uL (ref 0.7–3.1)
Lymphs: 25 %
MCHC: 35.1 g/dL (ref 31.5–35.7)
Neutrophils Absolute: 3.9 10*3/uL (ref 1.4–7.0)
Neutrophils: 66 %
RDW: 13.3 % (ref 11.6–15.4)

## 2022-09-30 LAB — COMPREHENSIVE METABOLIC PANEL
Albumin: 5 g/dL (ref 4.1–5.1)
Alkaline Phosphatase: 90 IU/L (ref 44–121)
BUN/Creatinine Ratio: 11 (ref 9–20)
BUN: 10 mg/dL (ref 6–20)
Bilirubin Total: 0.7 mg/dL (ref 0.0–1.2)
CO2: 25 mmol/L (ref 20–29)
Calcium: 9.8 mg/dL (ref 8.7–10.2)
Chloride: 102 mmol/L (ref 96–106)
Creatinine, Ser: 0.93 mg/dL (ref 0.76–1.27)
Globulin, Total: 2.2 g/dL (ref 1.5–4.5)
Glucose: 78 mg/dL (ref 70–99)
Potassium: 3.9 mmol/L (ref 3.5–5.2)
eGFR: 108 mL/min/{1.73_m2} (ref >59)

## 2022-09-30 LAB — HEPATITIS B CORE ANTIBODY, TOTAL: Hep B Core Total Ab: NEGATIVE

## 2022-09-30 LAB — HEPATITIS B SURFACE ANTIBODY,QUALITATIVE: Hep B Surface Ab, Qual: REACTIVE

## 2022-09-30 LAB — IGG, IGA, IGM
IgA/Immunoglobulin A, Serum: 181 mg/dL (ref 90–386)
IgM (Immunoglobulin M), Srm: 126 mg/dL (ref 20–172)

## 2022-09-30 LAB — HEPATITIS B SURFACE ANTIGEN: Hepatitis B Surface Ag: NEGATIVE

## 2022-10-02 NOTE — Telephone Encounter (Signed)
Dr. Epimenio Foot- what therapy would you recommend for him based on lab results?

## 2022-10-04 NOTE — Telephone Encounter (Signed)
JCV ab drawn on 09/27/22 indeterminate, index: 0.33. Inhibition assay: positive. Gave to MD to review.

## 2022-10-09 ENCOUNTER — Telehealth: Payer: Self-pay | Admitting: *Deleted

## 2022-10-09 NOTE — Telephone Encounter (Signed)
Faxed completed/signed Briumvi start form to Briumvi pt support at [phone]. Received fax confirmation. Gave this and signed orders below to [facility] to start processing for pt.

## 2022-11-02 DIAGNOSIS — Z0001 Encounter for general adult medical examination with abnormal findings: Secondary | ICD-10-CM | POA: Diagnosis not present

## 2022-11-18 ENCOUNTER — Encounter: Payer: Self-pay | Admitting: Neurology

## 2022-11-20 DIAGNOSIS — G35 Multiple sclerosis: Secondary | ICD-10-CM | POA: Diagnosis not present

## 2022-11-21 ENCOUNTER — Encounter: Payer: Self-pay | Admitting: Neurology

## 2022-12-04 DIAGNOSIS — G35 Multiple sclerosis: Secondary | ICD-10-CM | POA: Diagnosis not present

## 2023-01-02 DIAGNOSIS — F900 Attention-deficit hyperactivity disorder, predominantly inattentive type: Secondary | ICD-10-CM | POA: Diagnosis not present

## 2023-04-03 ENCOUNTER — Encounter: Payer: Self-pay | Admitting: Neurology

## 2023-04-03 ENCOUNTER — Other Ambulatory Visit: Payer: Self-pay | Admitting: *Deleted

## 2023-04-04 ENCOUNTER — Other Ambulatory Visit: Payer: Self-pay | Admitting: *Deleted

## 2023-04-04 MED ORDER — MODAFINIL 200 MG PO TABS: 200.0000 mg | ORAL_TABLET | Freq: Every morning | ORAL | 5 refills | Status: DC

## 2023-05-09 ENCOUNTER — Encounter: Payer: Self-pay | Admitting: Neurology

## 2023-05-13 DIAGNOSIS — G35 Multiple sclerosis: Secondary | ICD-10-CM | POA: Diagnosis not present

## 2023-05-31 ENCOUNTER — Ambulatory Visit: Payer: BC Managed Care – PPO | Admitting: Physician Assistant

## 2023-06-05 ENCOUNTER — Encounter: Payer: Self-pay | Admitting: Neurology

## 2023-06-26 ENCOUNTER — Other Ambulatory Visit: Payer: Self-pay | Admitting: Medical Genetics

## 2023-06-27 DIAGNOSIS — F900 Attention-deficit hyperactivity disorder, predominantly inattentive type: Secondary | ICD-10-CM | POA: Diagnosis not present

## 2023-07-05 ENCOUNTER — Ambulatory Visit: Payer: BC Managed Care – PPO | Admitting: Urgent Care

## 2023-07-05 ENCOUNTER — Encounter: Payer: Self-pay | Admitting: Urgent Care

## 2023-07-05 VITALS — BP 129/87 | HR 110 | Ht 67.0 in | Wt 159.8 lb

## 2023-07-05 DIAGNOSIS — I809 Phlebitis and thrombophlebitis of unspecified site: Secondary | ICD-10-CM

## 2023-07-05 DIAGNOSIS — R4781 Slurred speech: Secondary | ICD-10-CM

## 2023-07-05 DIAGNOSIS — N528 Other male erectile dysfunction: Secondary | ICD-10-CM

## 2023-07-05 DIAGNOSIS — G35 Multiple sclerosis: Secondary | ICD-10-CM

## 2023-07-05 NOTE — Patient Instructions (Addendum)
You have phlebitis, which is inflammation of a superficial vein. Read the attached handout. Apply heat to the affected area and take 600mg  of ibuprofen every 8 hours with food for the next 5-7 days  If you develop swelling in your forearm or hand, please seek medical attention immediately.  Please return in two months for Adderall refill. Schedule an annual physical in May.

## 2023-07-05 NOTE — Progress Notes (Signed)
New Patient Office Visit  Subjective:  Patient ID: Nathaniel Tay., male    DOB: 1986/01/20  Age: 38 y.o. MRN: [id number]  CC:  Chief Complaint  Patient presents with   Establish Care    New pt est care. Pt has MS and has this vein on his left arm that hurts.     HPI Nathaniel Hasting. presents to establish care   Discussed the use of AI scribe software for clinical note transcription with the patient, who gave verbal consent to proceed.  History of Present Illness   Nathaniel Morais. "Nathaniel Carlson" is a 38 year old male with multiple sclerosis who presents for establishing care and management of his condition.  He was diagnosed with multiple sclerosis (MS) in 2019, although symptoms began in 2017. Initial symptoms included sudden onset of left-sided body stiffness, drooping, and slurred speech, which resolved spontaneously. A head injury later exacerbated slurred speech and staggered movement, leading to further investigation and diagnosis. He has relapsing-remitting MS and receives Briumvi infusions every six months, with the last infusion in December 2024. He previously tried Gilenya, which was discontinued due to adverse effects and lack of efficacy.  He experiences persistent slurred speech, numbness, and difficulty talking. He takes Adderall XR 30 mg in the morning and an additional 30 mg (regular release) in the afternoon for energy and focus issues related to MS. He is also on Provigil for energy. Despite these medications, he reports significant fatigue and memory issues.  He reports a new onset of a painful lump in his L arm, described as rising and hardening with activity. No swelling in the hand or forearm and no trauma to the area. He is uncertain if this was where the IV was placed for his last infusion.  He is a single father raising three children aged 21, 7, and 57.       Outpatient Encounter Medications as of 07/05/2023  Medication Sig    amphetamine-dextroamphetamine (ADDERALL XR) 30 MG 24 hr capsule Take 1 capsule (30 mg total) by mouth every morning.   amphetamine-dextroamphetamine (ADDERALL) 15 MG tablet Take 1 tablet by mouth daily after lunch. (Patient taking differently: Take 30 mg by mouth daily after lunch.)   modafinil (PROVIGIL) 200 MG tablet Take 1 tablet (200 mg total) by mouth every morning.   sildenafil (VIAGRA) 100 MG tablet Take 1/2 to 1 pill po qd prn.   Ublituximab-xiiy (BRIUMVI) 150 MG/6ML SOLN Inject into the vein.   [DISCONTINUED] GILENYA 0.5 MG CAPS TAKE 1 CAPSULE ONCE A DAY (Patient not taking: Reported on 07/05/2023)   [DISCONTINUED] predniSONE (DELTASONE) 50 MG tablet 12 pills (600 mg) po qd x 3 days (high dose steroid for MS exacerbation) (Patient not taking: Reported on 07/05/2023)   No facility-administered encounter medications on file as of 07/05/2023.    Past Medical History:  Diagnosis Date   ADD (attention deficit disorder)    Hepatitis B    Multiple sclerosis (HCC) 04/28/2018   Stroke (HCC)    Vision abnormalities     Past Surgical History:  Procedure Laterality Date   orif, left arm      Family History  Problem Relation Age of Onset   Hypertension Mother    Asthma Mother    Hypertension Brother     Social History   Socioeconomic History   Marital status: Widowed    Spouse name: Not on file   Number of children: 3   Years of education: Not  on file   Highest education level: Not on file  Occupational History   Not on file  Tobacco Use   Smoking status: Every Day    Types: Cigarettes   Smokeless tobacco: Never   Tobacco comments:    4 cigarettes per day  Vaping Use   Vaping status: Never Used  Substance and Sexual Activity   Alcohol use: Not Currently   Drug use: Never   Sexual activity: Yes    Partners: Female  Other Topics Concern   Not on file  Social History Narrative   ** Merged History Encounter ** ** Merged History Encounter **          Right handed     Wear glasses    Drinks one soda per day, drinks sweet    Social Drivers of Corporate investment banker Strain: Not on file  Food Insecurity: Not on file  Transportation Needs: Not on file  Physical Activity: Not on file  Stress: Not on file  Social Connections: Not on file  Intimate Partner Violence: Not on file    ROS: as noted in HPI  Objective:  BP 129/87   Pulse (!) 110   Ht 5\' 7"  (1.702 m)   Wt 159 lb 12.8 oz (72.5 kg)   SpO2 98%   BMI 25.03 kg/m   Physical Exam Vitals and nursing note reviewed.  Constitutional:      General: He is not in acute distress.    Appearance: Normal appearance. He is normal weight. He is not ill-appearing, toxic-appearing or diaphoretic.  HENT:     Head: Normocephalic and atraumatic.     Right Ear: External ear normal.     Left Ear: External ear normal.     Mouth/Throat:     Mouth: Mucous membranes are moist.  Eyes:     General: No scleral icterus.       Right eye: No discharge.        Left eye: No discharge.  Cardiovascular:     Rate and Rhythm: Normal rate.  Pulmonary:     Effort: Pulmonary effort is normal. No respiratory distress.  Musculoskeletal:       Arms:     Cervical back: Normal range of motion and neck supple. No rigidity or tenderness.  Lymphadenopathy:     Cervical: No cervical adenopathy.  Skin:    General: Skin is warm and dry.     Findings: No erythema or rash.  Neurological:     Mental Status: He is alert and oriented to person, place, and time.  Psychiatric:        Mood and Affect: Mood normal.        Behavior: Behavior normal.     Assessment & Plan:  Phlebitis  MS (multiple sclerosis) (HCC)  Slurred speech  Other male erectile dysfunction  Assessment and Plan    Multiple Sclerosis (Relapsing-Remitting) Diagnosed in 2017 with symptoms of left-sided weakness and slurred speech. Currently on Briumvi infusions every six months, but reports no improvement in symptoms. Previously tried Gilenya with  no benefit. -Continue Briumvi infusions as scheduled. -Consider alternative treatments if no improvement noted.  Fatigue and Difficulty Focusing Likely related to MS. Currently managed with Adderall XR 30mg  in the morning and 30mg  in the afternoon, and Provigil. -Continue current regimen. -Annual drug screen required for continued Adderall prescription.  Thrombophlebitis Complaint of a hard, painful area on the arm. No signs of distal swelling or progression of the area. -Advise heat application  and over-the-counter anti-inflammatory medication for one week. -Monitor for signs of progression or distal swelling.  Erectile Dysfunction Managed with Sildenafil. -Continue current regimen.  General Health Maintenance -Schedule annual physical in May. -Continue Adderall with refill in two months, following the signing of a non-narcotic controlled substance agreement. -Annual drug screen to be conducted.        Return in about 2 months (around 09/02/2023).   Maretta Bees, PA

## 2023-07-08 ENCOUNTER — Encounter: Payer: Self-pay | Admitting: Urgent Care

## 2023-07-08 DIAGNOSIS — R5383 Other fatigue: Secondary | ICD-10-CM

## 2023-07-12 ENCOUNTER — Other Ambulatory Visit: Payer: BC Managed Care – PPO

## 2023-07-12 ENCOUNTER — Other Ambulatory Visit (HOSPITAL_COMMUNITY): Payer: Medicaid Other

## 2023-07-12 DIAGNOSIS — R5383 Other fatigue: Secondary | ICD-10-CM

## 2023-07-12 LAB — FSH/LH
FSH: 2.9 m[IU]/mL (ref 1.4–12.8)
LH: 2.1 m[IU]/mL (ref 1.5–9.3)

## 2023-07-12 LAB — TESTOSTERONE: Testosterone: 737 ng/dL (ref 250–827)

## 2023-07-13 ENCOUNTER — Encounter: Payer: Self-pay | Admitting: Urgent Care

## 2023-07-13 DIAGNOSIS — R5383 Other fatigue: Secondary | ICD-10-CM

## 2023-07-15 ENCOUNTER — Encounter: Payer: Self-pay | Admitting: Neurology

## 2023-07-19 ENCOUNTER — Other Ambulatory Visit: Payer: BC Managed Care – PPO

## 2023-07-19 DIAGNOSIS — R5383 Other fatigue: Secondary | ICD-10-CM

## 2023-07-19 LAB — COMPREHENSIVE METABOLIC PANEL
ALT: 47 U/L (ref 0–53)
AST: 24 U/L (ref 0–37)
Albumin: 4.9 g/dL (ref 3.5–5.2)
Alkaline Phosphatase: 81 U/L (ref 39–117)
BUN: 13 mg/dL (ref 6–23)
CO2: 29 meq/L (ref 19–32)
Calcium: 9.7 mg/dL (ref 8.4–10.5)
Chloride: 104 meq/L (ref 96–112)
Creatinine, Ser: 0.89 mg/dL (ref 0.40–1.50)
GFR: 108.18 mL/min (ref >60.00)
Glucose, Bld: 54 mg/dL — ABNORMAL LOW (ref 70–99)
Potassium: 4 meq/L (ref 3.5–5.1)
Sodium: 141 meq/L (ref 135–145)
Total Bilirubin: 0.6 mg/dL (ref 0.2–1.2)
Total Protein: 7.3 g/dL (ref 6.0–8.3)

## 2023-07-19 LAB — B12 AND FOLATE PANEL
Folate: 8.3 ng/mL (ref >5.9)
Vitamin B-12: 203 pg/mL — ABNORMAL LOW (ref 211–911)

## 2023-07-19 LAB — CBC WITH DIFFERENTIAL/PLATELET
Basophils Absolute: 0 10*3/uL (ref 0.0–0.1)
Basophils Relative: 0.8 % (ref 0.0–3.0)
Eosinophils Absolute: 0.1 10*3/uL (ref 0.0–0.7)
Eosinophils Relative: 2.5 % (ref 0.0–5.0)
HCT: 49.1 % (ref 39.0–52.0)
Hemoglobin: 16.5 g/dL (ref 13.0–17.0)
Lymphocytes Relative: 21.3 % (ref 12.0–46.0)
Lymphs Abs: 1.2 10*3/uL (ref 0.7–4.0)
MCHC: 33.6 g/dL (ref 30.0–36.0)
MCV: 92.5 fl (ref 78.0–100.0)
Monocytes Absolute: 0.5 10*3/uL (ref 0.1–1.0)
Monocytes Relative: 9.2 % (ref 3.0–12.0)
Neutro Abs: 3.8 10*3/uL (ref 1.4–7.7)
Neutrophils Relative %: 66.2 % (ref 43.0–77.0)
Platelets: 244 10*3/uL (ref 150.0–400.0)
RBC: 5.3 Mil/uL (ref 4.22–5.81)
RDW: 14.8 % (ref 11.5–15.5)
WBC: 5.8 10*3/uL (ref 4.0–10.5)

## 2023-07-19 LAB — TSH: TSH: 1.87 u[IU]/mL (ref 0.35–5.50)

## 2023-07-19 LAB — MAGNESIUM: Magnesium: 2.3 mg/dL (ref 1.5–2.5)

## 2023-07-19 LAB — VITAMIN D 25 HYDROXY (VIT D DEFICIENCY, FRACTURES): VITD: 16.43 ng/mL — ABNORMAL LOW (ref 30.00–100.00)

## 2023-07-22 ENCOUNTER — Other Ambulatory Visit: Payer: Self-pay | Admitting: Urgent Care

## 2023-07-22 ENCOUNTER — Encounter: Payer: Self-pay | Admitting: Urgent Care

## 2023-07-22 DIAGNOSIS — E538 Deficiency of other specified B group vitamins: Secondary | ICD-10-CM

## 2023-07-22 DIAGNOSIS — E559 Vitamin D deficiency, unspecified: Secondary | ICD-10-CM

## 2023-07-22 MED ORDER — VITAMIN D (ERGOCALCIFEROL) 1.25 MG (50000 UNIT) PO CAPS: 50000.0000 [IU] | ORAL_CAPSULE | ORAL | 0 refills | Status: DC

## 2023-07-22 NOTE — Progress Notes (Signed)
 Vit D replacement therapy called in for once weekly x 12 weeks

## 2023-07-25 LAB — ANA SCREEN,IFA,REFLEX TITER/PATTERN,REFLEX MPLX 11 AB CASCADE
Anti Nuclear Antibody (ANA): NEGATIVE
Cyclic Citrullin Peptide Ab: <16 U
MUTATED CITRULLINATED VIMENTIN (MCV) AB: <20 U/mL (ref <20)
Rheumatoid fact SerPl-aCnc: <10 [IU]/mL (ref <14)

## 2023-07-26 ENCOUNTER — Other Ambulatory Visit

## 2023-07-26 DIAGNOSIS — Z006 Encounter for examination for normal comparison and control in clinical research program: Secondary | ICD-10-CM

## 2023-07-30 ENCOUNTER — Encounter: Payer: Self-pay | Admitting: *Deleted

## 2023-08-06 LAB — GENECONNECT MOLECULAR SCREEN: Genetic Analysis Overall Interpretation: NEGATIVE

## 2023-08-13 ENCOUNTER — Encounter: Payer: Self-pay | Admitting: Neurology

## 2023-08-13 ENCOUNTER — Ambulatory Visit: Admitting: Neurology

## 2023-08-13 VITALS — BP 132/93 | HR 86 | Ht 67.0 in | Wt 164.0 lb

## 2023-08-13 DIAGNOSIS — Z79899 Other long term (current) drug therapy: Secondary | ICD-10-CM

## 2023-08-13 DIAGNOSIS — G35 Multiple sclerosis: Secondary | ICD-10-CM

## 2023-08-13 DIAGNOSIS — R200 Anesthesia of skin: Secondary | ICD-10-CM

## 2023-08-13 DIAGNOSIS — R269 Unspecified abnormalities of gait and mobility: Secondary | ICD-10-CM

## 2023-08-13 DIAGNOSIS — G35D Multiple sclerosis, unspecified: Secondary | ICD-10-CM

## 2023-08-13 MED ORDER — MODAFINIL 200 MG PO TABS: 200.0000 mg | ORAL_TABLET | Freq: Every morning | ORAL | 1 refills | Status: DC

## 2023-08-13 NOTE — Progress Notes (Addendum)
 [facility]  PATIENT: Nathaniel Carlson. DOB: 01-05-1986  REFERRING DOCTOR OR PCP: Crissie Dome, NP SOURCE: Patient, notes from primary care, notes from neurology, imaging and lab reports, MRI images personally reviewed.  _________________________________   HISTORICAL  CHIEF COMPLAINT:  Chief Complaint  Patient presents with   Follow-up    Pt in 10 alone  Pt here for MS f/u Pt states had MS flare up April 2024 Pt states still has issues finding words to say and increase stuttering     HISTORY OF PRESENT ILLNESS:  Nathaniel Carlson is a 38 y.o. man with relapsing remitting multiple sclerosis.  Update 08/13/2023: He is on Briumvi, last infusion 04/2023.  He has tolerated it well.    He denies any exacerbation or new MS symptoms  Gait is a mildly off balanced and this worsens as the day goes on.  He sometimes uses the bannister,.   H can easily walk > 100 meters.  He feels weak in his hands throughout the day and his legs feel weaker late in the day.    Sometimes he drags is feet .  He has mild left arm numbness but no painful dysesthesias.  Bladder function is doing well.   No hesitancy.   He has had ED since the onset of symptoms 08/2022 and is on sildenafil  He wears glasses.  He has fatigue that since 4/24, helped by adding modafinil  to the Adderall.   He is sleeping well   He has noted auditory processing and reduced word finding difficulty, better on Adderall/modafinil .    Mood is doing well    MS History: He is a 38 year old man who was diagnosed with MS in 2019 who presented to the emergency room March 12, 2018 with a head injury.  A CT scan of the head showed a left frontal abnormal focus and he had an MRI of the brain with and without contrast.  It was consistent with multiple sclerosis.  The left frontal lobe focus had a tumefactive enhancing appearance.  He was referred to Dr. Ruthine Cowper Who saw him shortly after the emergency room visit.  He was placed on  Gilenya.  He was lost to neurologic follow-up after 2020.   He took Gilenya for about one year but was having numbness so he stopped.  In retrospect, he has some other symptoms before his diagnosis in 2019..  At age 62 he had diplopia and visual changes that gradually improved over a few weeks.  In 2017 he had an episode of right-sided numbness and slurred speech with ataxia that lasted only a few hours.  He was doing well with his medication until early April 2024.   He was working under a car when he had the onset of slurred speech, hand weakness and clumsiness.   Symptoms were significant and stable for the first 4 or 5 days and then he improved a little but not to baseline.   His fatigue is much worse since then and has not improved.   He had a repeat MRI of the brain on 09/07/2022 that showed significant progression with many more T2/FLAIR hyperintense foci in the hemispheres compared to the 2020 MRI.  1 large focus in the right frontal lobe enhanced.  2 other smaller enhancing foci are noted in the right frontal lobe also enhanced.    Imaging: MRI of the brain 03/13/2018 showed some scattered T2/FLAIR hypertense foci in the periventricular, juxtacortical and deep white matter of the cerebral hemispheres.  1 focus in the left frontal lobe is acute with enhancement (tumefactive appearance).  Another small focus that enhanced results are noted.  MRI of the cervical spine 04/09/2018 showed a possible subtle focus at the cervicomedullary junction.  The spinal cord is otherwise normal.  No significant degenerative changes.  MRI of the brain 09/07/2022 shows multiple T2/FLAIR hyperintense foci in the periventricular, juxtacortical and deep white matter.  One of the foci in the right frontal lobe enhances after contrast administration consistent with acute demyelination.  There is a smaller focus nearby in the right frontal lobe that also has subtle enhancement.      REVIEW OF SYSTEMS: Constitutional:  No fevers, chills, sweats, or change in appetite Eyes: No visual changes, double vision, eye pain Ear, nose and throat: No hearing loss, ear pain, nasal congestion, sore throat Cardiovascular: No chest pain, palpitations Respiratory:  No shortness of breath at rest or with exertion.   No wheezes GastrointestinaI: No nausea, vomiting, diarrhea, abdominal pain, fecal incontinence Genitourinary:  No dysuria, urinary retention or frequency.  No nocturia. Musculoskeletal:  No neck pain, back pain Integumentary: No rash, pruritus, skin lesions Neurological: as above Psychiatric: No depression at this time.  No anxiety Endocrine: No palpitations, diaphoresis, change in appetite, change in weigh or increased thirst Hematologic/Lymphatic:  No anemia, purpura, petechiae. Allergic/Immunologic: No itchy/runny eyes, nasal congestion, recent allergic reactions, rashes  ALLERGIES: Allergies  Allergen Reactions   Morphine     Pt said he grandfather passed away from morphine at the hospital. Pt has not never had morphine.     HOME MEDICATIONS:  Current Outpatient Medications:    amphetamine -dextroamphetamine  (ADDERALL XR) 30 MG 24 hr capsule, Take 1 capsule (30 mg total) by mouth every morning., Disp: 30 capsule, Rfl: 0   amphetamine -dextroamphetamine  (ADDERALL) 15 MG tablet, Take 1 tablet by mouth daily after lunch. (Patient taking differently: Take 30 mg by mouth daily after lunch.), Disp: 30 tablet, Rfl: 0   modafinil  (PROVIGIL ) 200 MG tablet, Take 1 tablet (200 mg total) by mouth every morning., Disp: 30 tablet, Rfl: 5   sildenafil  (VIAGRA ) 100 MG tablet, Take 1/2 to 1 pill po qd prn., Disp: 30 tablet, Rfl: 3   Ublituximab-xiiy (BRIUMVI) 150 MG/6ML SOLN, Inject into the vein., Disp: , Rfl:    vitamin B-12 (CYANOCOBALAMIN ) 100 MCG tablet, Take 100 mcg by mouth daily., Disp: , Rfl:    Vitamin D , Ergocalciferol , (DRISDOL ) 1.25 MG (50000 UNIT) CAPS capsule, Take 1 capsule (50,000 Units total) by mouth  every 7 (seven) days., Disp: 12 capsule, Rfl: 0  PAST MEDICAL HISTORY: Past Medical History:  Diagnosis Date   ADD (attention deficit disorder)    Hepatitis B    Multiple sclerosis (HCC) 04/28/2018   Stroke (HCC)    Vision abnormalities     PAST SURGICAL HISTORY: Past Surgical History:  Procedure Laterality Date   orif, left arm      FAMILY HISTORY: Family History  Problem Relation Age of Onset   Hypertension Mother    Asthma Mother    Hypertension Brother    Multiple sclerosis Neg Hx     SOCIAL HISTORY: Social History   Socioeconomic History   Marital status: Widowed    Spouse name: Not on file   Number of children: 3   Years of education: Not on file   Highest education level: 12th grade  Occupational History   Not on file  Tobacco Use   Smoking status: Every Day    Types: Cigarettes  Smokeless tobacco: Never   Tobacco comments:    4 cigarettes per day  Vaping Use   Vaping status: Never Used  Substance and Sexual Activity   Alcohol use: Not Currently   Drug use: Never   Sexual activity: Yes    Partners: Female  Other Topics Concern   Not on file  Social History Narrative   ** Merged History Encounter ** ** Merged History Encounter **          Right handed    Wear glasses    Drinks one soda per day, drinks sweet    Pt works    Pt lives with family    Social Drivers of Corporate investment banker Strain: Low Risk  (07/15/2023)   Overall Financial Resource Strain (CARDIA)    Difficulty of Paying Living Expenses: Not very hard  Food Insecurity: No Food Insecurity (07/15/2023)   Hunger Vital Sign    Worried About Running Out of Food in the Last Year: Never true    Ran Out of Food in the Last Year: Never true  Transportation Needs: No Transportation Needs (07/15/2023)   PRAPARE - Administrator, Civil Service (Medical): No    Lack of Transportation (Non-Medical): No  Physical Activity: Insufficiently Active (07/15/2023)   Exercise Vital  Sign    Days of Exercise per Week: 3 days    Minutes of Exercise per Session: 40 min  Stress: No Stress Concern Present (07/15/2023)   [employer] of Occupational Health - Occupational Stress Questionnaire    Feeling of Stress : Not at all  Social Connections: Moderately Isolated (07/15/2023)   Social Connection and Isolation Panel [NHANES]    Frequency of Communication with Friends and Family: More than three times a week    Frequency of Social Gatherings with Friends and Family: More than three times a week    Attends Religious Services: More than 4 times per year    Active Member of [employer] or Organizations: No    Attends Banker Meetings: Not on file    Marital Status: Widowed  Intimate Partner Violence: Not on file       PHYSICAL EXAM  Vitals:   08/13/23 0927  BP: (!) 132/93  Pulse: 86  Weight: 164 lb (74.4 kg)  Height: 5' 7 (1.702 m)    Body mass index is 25.69 kg/m.  No results found.   General: The patient is well-developed and well-nourished and in no acute distress  HEENT:  Head is Falkner/AT.  Sclera are anicteric.  Funduscopic exam shows normal optic discs and retinal vessels.  Neck: No carotid bruits are noted.  The neck is nontender.  Cardiovascular: The heart has a regular rate and rhythm with a normal S1 and S2. There were no murmurs, gallops or rubs.    Skin: Extremities are without rash or  edema.  Musculoskeletal:  Back is nontender  Neurologic Exam  Mental status: The patient is alert and oriented x 3 at the time of the examination. The patient has apparent normal recent and remote memory, with an apparently normal attention span and concentration ability.   Speech is normal.  Cranial nerves: Extraocular movements are full. Pupils are equal, round, and reactive to light and accomodation.,  Vision was symmetric.  Visual fields are full.  Facial symmetry is present. There is good facial sensation to soft touch bilaterally.Facial strength  is normal.  Trapezius and sternocleidomastoid strength is normal.  He had very mild dysarthria the  tongue is midline, and the patient has symmetric elevation of the soft palate. No obvious hearing deficits are noted.  Motor:  Muscle bulk is normal.   Tone is normal. Strength is  5 / 5 in all 4 extremities except 4+/5 strength in the interossei muscles of the hands.   Sensory: Sensory testing is intact to pinprick, soft touch and vibration sensation in all 4 extremities.  Coordination: Cerebellar testing reveals good finger-nose-finger and slightly reduced heel-to-shin bilaterally.  Gait and station: Station is normal.   The gait is slightly wide.  Tande,m gait is poor.  Romberg is negative.   Reflexes: Deep tendon reflexes are symmetric and normal in the arms but increase in the legs with crossed abductors at the knees.  No ankle clonus noted..   Plantar responses are flexor.    DIAGNOSTIC DATA (LABS, IMAGING, TESTING) - I reviewed patient records, labs, notes, testing and imaging myself where available.  Lab Results  Component Value Date   WBC 5.8 07/19/2023   HGB 16.5 07/19/2023   HCT 49.1 07/19/2023   MCV 92.5 07/19/2023   PLT 244.0 07/19/2023      Component Value Date/Time   NA 141 07/19/2023 0756   NA 141 09/27/2022 1458   K 4.0 07/19/2023 0756   CL 104 07/19/2023 0756   CO2 29 07/19/2023 0756   GLUCOSE 54 (L) 07/19/2023 0756   BUN 13 07/19/2023 0756   BUN 10 09/27/2022 1458   CREATININE 0.89 07/19/2023 0756   CALCIUM 9.7 07/19/2023 0756   PROT 7.3 07/19/2023 0756   PROT 7.2 09/27/2022 1458   ALBUMIN 4.9 07/19/2023 0756   ALBUMIN 5.0 09/27/2022 1458   AST 24 07/19/2023 0756   ALT 47 07/19/2023 0756   ALKPHOS 81 07/19/2023 0756   BILITOT 0.6 07/19/2023 0756   BILITOT 0.7 09/27/2022 1458   GFRNONAA 98 05/19/2018 1449   GFRAA 113 05/19/2018 1449    Lab Results  Component Value Date   TSH 1.87 07/19/2023       ASSESSMENT AND PLAN  Multiple sclerosis (HCC)  - Plan: IgG, IgA, IgM, CBC with Differential/Platelet, MR BRAIN W WO CONTRAST  High risk medication use - Plan: IgG, IgA, IgM, CBC with Differential/Platelet, MR BRAIN W WO CONTRAST  Numbness - Plan: MR BRAIN W WO CONTRAST  Gait disturbance - Plan: MR BRAIN W WO CONTRAST  Continue Briumvi.  He can walk > 100 meters.    Check labs Continue Adderall/Modafinil  for MS related ADD and fatigue Stay active and exercise. Rtc 6 months, sooner if new or worsening issues  Nathaniel Palma A. Godwin Lat, MD, [facility] 08/13/2023, 9:55 AM Certified in Neurology, Clinical Neurophysiology, Sleep Medicine and Neuroimaging  [facility] [address], [address] [place], [place] [address] [phone]

## 2023-08-14 ENCOUNTER — Encounter: Payer: Self-pay | Admitting: Neurology

## 2023-08-14 LAB — CBC WITH DIFFERENTIAL/PLATELET
Basophils Absolute: 0.1 10*3/uL (ref 0.0–0.2)
Basos: 1 %
EOS (ABSOLUTE): 0.1 10*3/uL (ref 0.0–0.4)
Eos: 1 %
Hematocrit: 47.5 % (ref 37.5–51.0)
Hemoglobin: 16.3 g/dL (ref 13.0–17.7)
Immature Grans (Abs): 0 10*3/uL (ref 0.0–0.1)
Immature Granulocytes: 0 %
Lymphocytes Absolute: 1.3 10*3/uL (ref 0.7–3.1)
Lymphs: 9 %
MCH: 31.5 pg (ref 26.6–33.0)
MCHC: 34.3 g/dL (ref 31.5–35.7)
MCV: 92 fL (ref 79–97)
Monocytes Absolute: 1 10*3/uL — ABNORMAL HIGH (ref 0.1–0.9)
Monocytes: 7 %
Neutrophils Absolute: 12.2 10*3/uL — ABNORMAL HIGH (ref 1.4–7.0)
Neutrophils: 82 %
Platelets: 299 10*3/uL (ref 150–450)
RBC: 5.18 x10E6/uL (ref 4.14–5.80)
RDW: 12.9 % (ref 11.6–15.4)
WBC: 14.7 10*3/uL — ABNORMAL HIGH (ref 3.4–10.8)

## 2023-08-14 LAB — IGG, IGA, IGM
IgA/Immunoglobulin A, Serum: 165 mg/dL (ref 90–386)
IgG (Immunoglobin G), Serum: 857 mg/dL (ref 603–1613)
IgM (Immunoglobulin M), Srm: 91 mg/dL (ref 20–172)

## 2023-08-15 ENCOUNTER — Encounter: Payer: Self-pay | Admitting: Neurology

## 2023-08-20 ENCOUNTER — Encounter: Payer: Self-pay | Admitting: Neurology

## 2023-08-20 ENCOUNTER — Ambulatory Visit

## 2023-08-20 DIAGNOSIS — R269 Unspecified abnormalities of gait and mobility: Secondary | ICD-10-CM

## 2023-08-20 DIAGNOSIS — Z79899 Other long term (current) drug therapy: Secondary | ICD-10-CM | POA: Diagnosis not present

## 2023-08-20 DIAGNOSIS — G35 Multiple sclerosis: Secondary | ICD-10-CM | POA: Diagnosis not present

## 2023-08-20 DIAGNOSIS — R200 Anesthesia of skin: Secondary | ICD-10-CM

## 2023-08-20 MED ORDER — GADOBENATE DIMEGLUMINE 529 MG/ML IV SOLN
15.0000 mL | Freq: Once | INTRAVENOUS | Status: AC | PRN
  Administered 2023-08-20: 15 mL via INTRAVENOUS

## 2023-08-21 NOTE — Telephone Encounter (Signed)
 Spoke to patient made f/u appointment 02/2024

## 2023-08-22 ENCOUNTER — Other Ambulatory Visit: Payer: Self-pay | Admitting: Neurology

## 2023-08-22 NOTE — Telephone Encounter (Signed)
 Last seen on 08/13/23 Follow up scheduled on 02/18/24

## 2023-08-23 ENCOUNTER — Ambulatory Visit: Payer: BC Managed Care – PPO | Admitting: Urgent Care

## 2023-08-23 ENCOUNTER — Encounter: Payer: Self-pay | Admitting: Urgent Care

## 2023-08-23 VITALS — BP 128/86 | HR 86 | Ht 67.0 in | Wt 167.0 lb

## 2023-08-23 DIAGNOSIS — Z23 Encounter for immunization: Secondary | ICD-10-CM | POA: Diagnosis not present

## 2023-08-23 DIAGNOSIS — E538 Deficiency of other specified B group vitamins: Secondary | ICD-10-CM

## 2023-08-23 DIAGNOSIS — G35 Multiple sclerosis: Secondary | ICD-10-CM

## 2023-08-23 DIAGNOSIS — E559 Vitamin D deficiency, unspecified: Secondary | ICD-10-CM | POA: Diagnosis not present

## 2023-08-23 DIAGNOSIS — G35D Multiple sclerosis, unspecified: Secondary | ICD-10-CM

## 2023-08-23 DIAGNOSIS — Z0000 Encounter for general adult medical examination without abnormal findings: Secondary | ICD-10-CM | POA: Diagnosis not present

## 2023-08-23 MED ORDER — AMPHETAMINE-DEXTROAMPHET ER 30 MG PO CP24: 30.0000 mg | ORAL_CAPSULE | Freq: Every morning | ORAL | 0 refills | Status: DC

## 2023-08-23 MED ORDER — AMPHETAMINE-DEXTROAMPHETAMINE 30 MG PO TABS: 30.0000 mg | ORAL_TABLET | Freq: Every day | ORAL | 0 refills | Status: DC

## 2023-08-23 NOTE — Progress Notes (Signed)
 Annual Wellness Visit     Patient: Nathaniel Carlson., Male    DOB: March 20, 1986, 38 y.o.   MRN: [id number]  Subjective  Chief Complaint  Patient presents with   Annual Exam    None fasting CPE.    Nathaniel E Mayden Jr. is a 38 y.o. male who presents today for his Annual Wellness Visit. He reports consuming a general diet. Home exercise routine includes walking 1.5 miles per day with his dog (pug). He generally feels fairly well. He reports sleeping well. He does have additional problems to discuss today.   HPI  Discussed the use of AI scribe software for clinical note transcription with the patient, who gave verbal consent to proceed.  History of Present Illness   Nathaniel Carlson. "DJ" is a 38 year old male with multiple sclerosis who presents for an annual physical exam and follow-up after infusion therapy.  He was scheduled for a follow-up appointment two months after his infusion, which occurred on December 31st. His next infusion is scheduled for June. He is unsure if the treatment is effective as he has not noticed significant changes in his symptoms.  He describes his overall health as 'moderate,' stating that he is not as good as he used to be but not bad overall. He experiences sleepiness and fatigue, which he considers his biggest challenge, but otherwise feels good. He generally sleeps well at night, occasionally waking up once to use the bathroom, which he attributes to his multiple sclerosis. This change in his sleep pattern has been occurring for the past two years.  He is currently taking Adderall, with a regimen of 30 mg XR in the morning and 30 mg regular release in the afternoon. He has the discretion to adjust the afternoon dose if needed. He is not due for a refill until April 30th.  In terms of physical activity, he walks his dog daily for about a mile to a mile and a half. He also anticipates engaging in yard work as the season changes, which will keep him  active.  His diet is described as general, with a balance of meats and vegetables. He does not follow any specific dietary restrictions but tries to avoid overeating. He acknowledges consuming sweet teas and Pepsis occasionally, mostly on weekends, and drinks water throughout the day.  He had his last eye exam in August of the previous year and is due for another soon. He recently visited the dentist on the day of this appointment.      Vision:Within last year and Dental: Receives regular dental care and Last dental visit: had appointment this morning.   Patient Active Problem List   Diagnosis Date Noted   High risk medication use 09/27/2022   Gait disturbance 09/27/2022   Numbness 09/27/2022   Slurred speech 09/27/2022   Multiple sclerosis (HCC) 04/28/2018   Past Medical History:  Diagnosis Date   ADD (attention deficit disorder)    Hepatitis B    Multiple sclerosis (HCC) 04/28/2018   Neuromuscular disorder (HCC) 2019   Multiple Sclerosis   Stroke (HCC) 2019   Had a stroke like symptom in 2017 that ended up being MS with a CT scan after hitting head at work in 2019.   Vision abnormalities    Past Surgical History:  Procedure Laterality Date   orif, left arm     Social History   Socioeconomic History   Marital status: Widowed    Spouse name: Not on file  Number of children: 3   Years of education: Not on file   Highest education level: 12th grade  Occupational History   Not on file  Tobacco Use   Smoking status: Every Day    Types: Cigarettes   Smokeless tobacco: Never   Tobacco comments:    4 cigarettes per day  Vaping Use   Vaping status: Never Used  Substance and Sexual Activity   Alcohol use: Not Currently   Drug use: Never   Sexual activity: Yes    Partners: Female  Other Topics Concern   Not on file  Social History Narrative   ** Merged History Encounter ** ** Merged History Encounter **          Right handed    Wear glasses    Drinks one soda  per day, drinks sweet    Pt works    Pt lives with family    Social Drivers of Corporate investment banker Strain: Low Risk  (07/15/2023)   Overall Financial Resource Strain (CARDIA)    Difficulty of Paying Living Expenses: Not very hard  Food Insecurity: No Food Insecurity (07/15/2023)   Hunger Vital Sign    Worried About Running Out of Food in the Last Year: Never true    Ran Out of Food in the Last Year: Never true  Transportation Needs: No Transportation Needs (07/15/2023)   PRAPARE - Administrator, Civil Service (Medical): No    Lack of Transportation (Non-Medical): No  Physical Activity: Insufficiently Active (07/15/2023)   Exercise Vital Sign    Days of Exercise per Week: 3 days    Minutes of Exercise per Session: 40 min  Stress: No Stress Concern Present (07/15/2023)   [employer] of Occupational Health - Occupational Stress Questionnaire    Feeling of Stress : Not at all  Social Connections: Moderately Isolated (07/15/2023)   Social Connection and Isolation Panel [NHANES]    Frequency of Communication with Friends and Family: More than three times a week    Frequency of Social Gatherings with Friends and Family: More than three times a week    Attends Religious Services: More than 4 times per year    Active Member of [employer] or Organizations: No    Attends Banker Meetings: Not on file    Marital Status: Widowed  Intimate Partner Violence: Not on file      Medications: Outpatient Medications Prior to Visit  Medication Sig Note   modafinil (PROVIGIL) 200 MG tablet Take 1 tablet (200 mg total) by mouth every morning.    sildenafil (VIAGRA) 100 MG tablet TAKE 1/2 TO 1 TABLET BY MOUTH EVERY DAY AS NEEDED    Ublituximab-xiiy (BRIUMVI) 150 MG/6ML SOLN Inject into the vein every 6 (six) months.    vitamin B-12 (CYANOCOBALAMIN) 100 MCG tablet Take 100 mcg by mouth daily.    Vitamin D, Ergocalciferol, (DRISDOL) 1.25 MG (50000 UNIT) CAPS capsule Take 1  capsule (50,000 Units total) by mouth every 7 (seven) days.    [DISCONTINUED] amphetamine-dextroamphetamine (ADDERALL XR) 30 MG 24 hr capsule Take 1 capsule (30 mg total) by mouth every morning.    [DISCONTINUED] amphetamine-dextroamphetamine (ADDERALL) 30 MG tablet Take 30 mg by mouth daily. 08/23/2023: Pt takes in the afternoon   [DISCONTINUED] amphetamine-dextroamphetamine (ADDERALL) 15 MG tablet Take 1 tablet by mouth daily after lunch. (Patient not taking: Reported on 08/23/2023)    No facility-administered medications prior to visit.    Allergies  Allergen Reactions  Morphine     Pt said he grandfather passed away from morphine at the hospital. Pt has not never had morphine.     Patient Care Team: Mandy Second, [place] as PCP - General (Physician Assistant)  ROS Complete 12 point ROS performed with all pertinent positives listed in HPI      Objective  BP 128/86   Pulse 86   Ht 5\' 7"  (1.702 m)   Wt 167 lb (75.8 kg)   SpO2 99%   BMI 26.16 kg/m  BP Readings from Last 3 Encounters:  08/23/23 128/86  08/13/23 (!) 132/93  07/05/23 129/87   Wt Readings from Last 3 Encounters:  08/23/23 167 lb (75.8 kg)  08/13/23 164 lb (74.4 kg)  07/05/23 159 lb 12.8 oz (72.5 kg)      Physical Exam Vitals and nursing note reviewed.  Constitutional:      General: He is not in acute distress.    Appearance: Normal appearance. He is not ill-appearing, toxic-appearing or diaphoretic.  HENT:     Head: Normocephalic and atraumatic.     Right Ear: Tympanic membrane, ear canal and external ear normal. There is no impacted cerumen.     Left Ear: Tympanic membrane, ear canal and external ear normal. There is no impacted cerumen.     Nose: Nose normal.     Mouth/Throat:     Mouth: Mucous membranes are moist.     Pharynx: Oropharynx is clear. No oropharyngeal exudate or posterior oropharyngeal erythema.  Eyes:     General: No scleral icterus.       Right eye: No discharge.        Left eye:  No discharge.     Extraocular Movements: Extraocular movements intact.     Pupils: Pupils are equal, round, and reactive to light.  Neck:     Thyroid: No thyroid mass, thyromegaly or thyroid tenderness.  Cardiovascular:     Rate and Rhythm: Normal rate and regular rhythm.     Pulses: Normal pulses.     Heart sounds: No murmur heard. Pulmonary:     Effort: Pulmonary effort is normal. No respiratory distress.     Breath sounds: Normal breath sounds. No stridor. No wheezing or rhonchi.  Abdominal:     General: Abdomen is flat. Bowel sounds are normal. There is no distension.     Palpations: Abdomen is soft. There is no mass.     Tenderness: There is no abdominal tenderness. There is no guarding.  Musculoskeletal:     Cervical back: Normal range of motion and neck supple. No rigidity or tenderness.     Right lower leg: No edema.     Left lower leg: No edema.  Lymphadenopathy:     Cervical: No cervical adenopathy.  Skin:    General: Skin is warm and dry.     Coloration: Skin is not jaundiced.     Findings: No bruising, erythema or rash.  Neurological:     General: No focal deficit present.     Mental Status: He is alert and oriented to person, place, and time.     Sensory: No sensory deficit.     Motor: No weakness.  Psychiatric:        Mood and Affect: Mood normal.        Behavior: Behavior normal.       Most recent functional status assessment:     No data to display         Most recent fall  risk assessment:    07/05/2023    1:25 PM  Fall Risk   Falls in the past year? 0  Number falls in past yr: 0  Injury with Fall? 0  Risk for fall due to : No Fall Risks  Follow up Falls evaluation completed    Most recent depression screenings:    08/23/2023    1:45 PM 07/05/2023    1:25 PM  PHQ 2/9 Scores  PHQ - 2 Score 2 1  PHQ- 9 Score 9 6   Most recent cognitive screening:     No data to display         Most recent Audit-C alcohol use screening    07/15/2023     9:25 AM  Alcohol Use Disorder Test (AUDIT)  1. How often do you have a drink containing alcohol? 0  3. How often do you have six or more drinks on one occasion? 0   A score of 3 or more in women, and 4 or more in men indicates increased risk for alcohol abuse, EXCEPT if all of the points are from question 1   Vision/Hearing Screen: No results found.  Last CBC Lab Results  Component Value Date   WBC 14.7 (H) 08/13/2023   HGB 16.3 08/13/2023   HCT 47.5 08/13/2023   MCV 92 08/13/2023   MCH 31.5 08/13/2023   RDW 12.9 08/13/2023   PLT 299 08/13/2023   Last metabolic panel Lab Results  Component Value Date   GLUCOSE 54 (L) 07/19/2023   NA 141 07/19/2023   K 4.0 07/19/2023   CL 104 07/19/2023   CO2 29 07/19/2023   BUN 13 07/19/2023   CREATININE 0.89 07/19/2023   GFR 108.18 07/19/2023   CALCIUM 9.7 07/19/2023   PROT 7.3 07/19/2023   ALBUMIN 4.9 07/19/2023   LABGLOB 2.2 09/27/2022   AGRATIO 2.3 (H) 09/27/2022   BILITOT 0.6 07/19/2023   ALKPHOS 81 07/19/2023   AST 24 07/19/2023   ALT 47 07/19/2023   Last lipids No results found for: "CHOL", "HDL", "LDLCALC", "LDLDIRECT", "TRIG", "CHOLHDL" Last hemoglobin A1c No results found for: "HGBA1C" Last thyroid functions Lab Results  Component Value Date   TSH 1.87 07/19/2023   Last vitamin D Lab Results  Component Value Date   VD25OH 16.43 (L) 07/19/2023   Last vitamin B12 and Folate Lab Results  Component Value Date   VITAMINB12 203 (L) 07/19/2023   FOLATE 8.3 07/19/2023      No results found for any visits on 08/23/23.    Assessment & Plan   Annual wellness visit done today including the all of the following: Reviewed patient's Family Medical History Reviewed and updated list of patient's medical providers Assessment of cognitive impairment was done Assessed patient's functional ability Established a written schedule for health screening services Health Risk Assessent Completed and Reviewed  Exercise  Activities and Dietary recommendations  Goals   Increase physical activity Decrease fatigue     Immunization History  Administered Date(s) Administered   Hepatitis B, PED/ADOLESCENT 02/24/1996, 03/25/1996, 08/31/1996   PFIZER(Purple Top)SARS-COV-2 Vaccination 07/30/2019, 08/20/2019   PNEUMOCOCCAL CONJUGATE-20 08/23/2023   Tdap 01/26/2016    Health Maintenance  Topic Date Due   COVID-19 Vaccine (3 - Pfizer risk series) 07/28/2024 (Originally 09/17/2019)   INFLUENZA VACCINE  12/13/2023   DTaP/Tdap/Td (2 - Td or Tdap) 01/25/2026   Pneumococcal Vaccine 33-2 Years old  Completed   Hepatitis C Screening  Completed   HIV Screening  Completed   HPV VACCINES  Aged Out   Meningococcal B Vaccine  Aged Out     Discussed health benefits of physical activity, and encouraged him to engage in regular exercise appropriate for his age and condition.    Problem List Items Addressed This Visit   None Visit Diagnoses       Well adult health check    -  Primary   Relevant Orders   Lipid panel     Vitamin D deficiency       Relevant Orders   VITAMIN D 25 Hydroxy (Vit-D Deficiency, Fractures)     Vitamin B12 deficiency       Relevant Orders   Vitamin B12     MS (multiple sclerosis) (HCC)       Relevant Medications   amphetamine-dextroamphetamine (ADDERALL) 30 MG tablet (Start on 09/11/2023)   amphetamine-dextroamphetamine (ADDERALL XR) 30 MG 24 hr capsule (Start on 09/11/2023)     Need for Streptococcus pneumoniae and influenza vaccination       Relevant Orders   Pneumococcal conjugate vaccine 20-valent (Completed)      Assessment and Plan    Multiple Sclerosis (MS) Undergoing regular infusions. Reports fatigue and sleepiness. Discussed anti-inflammatory diet benefits, challenging to implement. Encouraged sugar reduction. - Continue regular MS infusions. - Encourage anti-inflammatory diet consideration.  Vitamin D and B12 Deficiency Under treatment. Recent labs conducted. Follow-up  labs planned for June. - Schedule follow-up labs for vitamin D and B12 in early June. - Continue vitamin D supplementation.  Attention Deficit Hyperactivity Disorder (ADHD) On Adderall XR 30 mg morning, 30 mg regular release afternoon. Discussed need for regular follow-up and annual urine drug screening. Managed prescription compliance. - Schedule follow-up every three months. - Order annual urine drug screen. - Refill Adderall, no fill until April 30.  General Health Maintenance Engages in regular physical activity. Discussed pneumococcal vaccine due to immunomodulating medication. Informed about mild side effects. - Administer pneumococcal vaccine today. - Schedule lipid panel and A1c test in June, ensure fasting. - Encourage continued physical activity and healthy diet. - Schedule routine eye exam.       Return in about 10 weeks (around 11/01/2023).     Mandy Second, PA

## 2023-08-23 NOTE — Patient Instructions (Addendum)
 Beginning of June, return for lab visit only - fasting.  One month of Adderall called in today, cannot be picked up until 09/11/23. Return end of June for routine ADHD appointment

## 2023-08-26 ENCOUNTER — Other Ambulatory Visit: Payer: Self-pay | Admitting: Neurology

## 2023-08-26 DIAGNOSIS — G35 Multiple sclerosis: Secondary | ICD-10-CM

## 2023-08-26 DIAGNOSIS — R200 Anesthesia of skin: Secondary | ICD-10-CM

## 2023-08-26 DIAGNOSIS — R269 Unspecified abnormalities of gait and mobility: Secondary | ICD-10-CM

## 2023-08-28 ENCOUNTER — Ambulatory Visit

## 2023-08-28 DIAGNOSIS — G35 Multiple sclerosis: Secondary | ICD-10-CM | POA: Diagnosis not present

## 2023-08-28 DIAGNOSIS — R269 Unspecified abnormalities of gait and mobility: Secondary | ICD-10-CM | POA: Diagnosis not present

## 2023-08-28 DIAGNOSIS — R200 Anesthesia of skin: Secondary | ICD-10-CM | POA: Diagnosis not present

## 2023-08-28 MED ORDER — GADOBENATE DIMEGLUMINE 529 MG/ML IV SOLN
15.0000 mL | Freq: Once | INTRAVENOUS | Status: AC | PRN
Start: 2023-08-28 — End: 2023-08-28
  Administered 2023-08-28: 15 mL via INTRAVENOUS

## 2023-08-29 ENCOUNTER — Encounter: Payer: Self-pay | Admitting: Neurology

## 2023-10-18 ENCOUNTER — Other Ambulatory Visit

## 2023-10-23 ENCOUNTER — Encounter: Payer: Self-pay | Admitting: Urgent Care

## 2023-10-23 DIAGNOSIS — R5383 Other fatigue: Secondary | ICD-10-CM

## 2023-10-23 DIAGNOSIS — G35 Multiple sclerosis: Secondary | ICD-10-CM

## 2023-10-23 MED ORDER — AMPHETAMINE-DEXTROAMPHET ER 30 MG PO CP24: 30.0000 mg | ORAL_CAPSULE | Freq: Every morning | ORAL | 0 refills | Status: DC

## 2023-10-23 MED ORDER — AMPHETAMINE-DEXTROAMPHET ER 30 MG PO CP24: 30.0000 mg | ORAL_CAPSULE | ORAL | 0 refills | Status: DC

## 2023-10-23 MED ORDER — AMPHETAMINE-DEXTROAMPHETAMINE 30 MG PO TABS: 30.0000 mg | ORAL_TABLET | Freq: Every day | ORAL | 0 refills | Status: DC

## 2023-10-25 ENCOUNTER — Other Ambulatory Visit

## 2023-10-25 DIAGNOSIS — E559 Vitamin D deficiency, unspecified: Secondary | ICD-10-CM | POA: Diagnosis not present

## 2023-10-25 DIAGNOSIS — E538 Deficiency of other specified B group vitamins: Secondary | ICD-10-CM | POA: Diagnosis not present

## 2023-10-25 DIAGNOSIS — Z0000 Encounter for general adult medical examination without abnormal findings: Secondary | ICD-10-CM | POA: Diagnosis not present

## 2023-10-25 NOTE — Addendum Note (Signed)
 Addended by: Jaquelyn Merino on: 10/25/2023 08:17 AM   Modules accepted: Orders

## 2023-10-26 ENCOUNTER — Ambulatory Visit: Payer: Self-pay | Admitting: Urgent Care

## 2023-10-26 LAB — LIPID PANEL
Cholesterol: 190 mg/dL (ref <200)
HDL: 48 mg/dL (ref >=40)
LDL Cholesterol (Calc): 124 mg/dL — ABNORMAL HIGH
Non-HDL Cholesterol (Calc): 142 mg/dL — ABNORMAL HIGH (ref <130)
Total CHOL/HDL Ratio: 4 (calc) (ref <5.0)
Triglycerides: 83 mg/dL (ref <150)

## 2023-10-26 LAB — VITAMIN B12: Vitamin B-12: 582 pg/mL (ref 200–1100)

## 2023-10-26 LAB — VITAMIN D 25 HYDROXY (VIT D DEFICIENCY, FRACTURES): Vit D, 25-Hydroxy: 87 ng/mL (ref 30–100)

## 2023-10-26 MED ORDER — AMPHETAMINE-DEXTROAMPHETAMINE 30 MG PO TABS: 30.0000 mg | ORAL_TABLET | Freq: Every day | ORAL | 0 refills | Status: DC

## 2023-10-26 MED ORDER — AMPHETAMINE-DEXTROAMPHET ER 30 MG PO CP24: 30.0000 mg | ORAL_CAPSULE | Freq: Every morning | ORAL | 0 refills | Status: DC

## 2023-10-26 MED ORDER — AMPHETAMINE-DEXTROAMPHET ER 30 MG PO CP24: 30.0000 mg | ORAL_CAPSULE | ORAL | 0 refills | Status: DC

## 2023-10-26 NOTE — Addendum Note (Signed)
 Addended by: Skylier Kretschmer on: 10/26/2023 08:13 PM   Modules accepted: Orders

## 2023-11-01 ENCOUNTER — Encounter: Payer: Self-pay | Admitting: Urgent Care

## 2023-11-01 ENCOUNTER — Ambulatory Visit (INDEPENDENT_AMBULATORY_CARE_PROVIDER_SITE_OTHER): Admitting: Urgent Care

## 2023-11-01 ENCOUNTER — Ambulatory Visit: Admitting: Urgent Care

## 2023-11-01 VITALS — BP 126/87 | HR 90 | Resp 18 | Ht 67.0 in | Wt 168.2 lb

## 2023-11-01 DIAGNOSIS — L821 Other seborrheic keratosis: Secondary | ICD-10-CM | POA: Diagnosis not present

## 2023-11-01 DIAGNOSIS — G35 Multiple sclerosis: Secondary | ICD-10-CM | POA: Diagnosis not present

## 2023-11-01 DIAGNOSIS — R5383 Other fatigue: Secondary | ICD-10-CM

## 2023-11-01 NOTE — Progress Notes (Signed)
 Established Patient Office Visit  Subjective:  Patient ID: Nathaniel Carlson., male    DOB: July 30, 1985  Age: 38 y.o. MRN: [id number]  Chief Complaint  Patient presents with   Medical Management of Chronic Issues    Blood work results medication refills    HPI  Discussed the use of AI scribe software for clinical note transcription with the patient, who Carlson verbal consent to proceed.  History of Present Illness   Nathaniel Carlson. Nathaniel Carlson is a 38 year old male who presents for a follow-up. He is accompanied by his son.  He has a history of vitamin D  deficiency, initially identified due to symptoms of fatigue. After completing a course of vitamin D  supplementation, his levels improved from 16 to 87, reaching the upper end of normal. He is no longer taking vitamin D  supplements.  He is currently taking B12 supplements daily, which he purchases from [employer]. He also takes two Adderall and Provigil  daily, and receives an MS injection every six months. Overall, his energy levels have responded well to the above treatment.  He has a skin lesion on his lower R back that causes discomfort as it frequently catches on his pants. It is described as raised and not flat, leading to irritation during daily activities.  No current complaints and he is feeling good overall. He denies needing any medication refills.       Patient Active Problem List   Diagnosis Date Noted   High risk medication use 09/27/2022   Gait disturbance 09/27/2022   Numbness 09/27/2022   Slurred speech 09/27/2022   Multiple sclerosis (HCC) 04/28/2018   Past Medical History:  Diagnosis Date   ADD (attention deficit disorder)    Hepatitis B    Multiple sclerosis (HCC) 04/28/2018   Neuromuscular disorder (HCC) 2019   Multiple Sclerosis   Stroke (HCC) 2019   Had a stroke like symptom in 2017 that ended up being MS with a CT scan after hitting head at work in 2019.   Vision abnormalities    Past Surgical  History:  Procedure Laterality Date   orif, left arm     Social History   Tobacco Use   Smoking status: Every Day    Types: Cigarettes   Smokeless tobacco: Never   Tobacco comments:    4 cigarettes per day  Vaping Use   Vaping status: Never Used  Substance Use Topics   Alcohol use: Not Currently   Drug use: Never      ROS: as noted in HPI  Objective:     BP 126/87 (BP Location: Left Arm, Patient Position: Sitting, Cuff Size: Normal)   Pulse 90   Resp 18   Ht 5' 7 (1.702 m)   Wt 168 lb 4 oz (76.3 kg)   SpO2 99%   BMI 26.35 kg/m  BP Readings from Last 3 Encounters:  11/01/23 126/87  08/23/23 128/86  08/13/23 (!) 132/93   Wt Readings from Last 3 Encounters:  11/01/23 168 lb 4 oz (76.3 kg)  08/23/23 167 lb (75.8 kg)  08/13/23 164 lb (74.4 kg)      Physical Exam Vitals and nursing note reviewed. Exam conducted with a chaperone present.  Constitutional:      Appearance: Normal appearance. He is normal weight.  HENT:     Head: Normocephalic and atraumatic.   Cardiovascular:     Rate and Rhythm: Normal rate.  Pulmonary:     Effort: Pulmonary effort is normal. No respiratory  distress.   Skin:    General: Skin is warm and dry.     Findings: Lesion (seborrheic keratosis to R lower back near pant line) present.       Neurological:     Mental Status: He is alert and oriented to person, place, and time.   Psychiatric:        Mood and Affect: Mood normal.        Behavior: Behavior normal.      No results found for any visits on 11/01/23.  Last CBC Lab Results  Component Value Date   WBC 14.7 (H) 08/13/2023   HGB 16.3 08/13/2023   HCT 47.5 08/13/2023   MCV 92 08/13/2023   MCH 31.5 08/13/2023   RDW 12.9 08/13/2023   PLT 299 08/13/2023   Last metabolic panel Lab Results  Component Value Date   GLUCOSE 54 (L) 07/19/2023   NA 141 07/19/2023   K 4.0 07/19/2023   CL 104 07/19/2023   CO2 29 07/19/2023   BUN 13 07/19/2023   CREATININE 0.89  07/19/2023   GFR 108.18 07/19/2023   CALCIUM 9.7 07/19/2023   PROT 7.3 07/19/2023   ALBUMIN 4.9 07/19/2023   LABGLOB 2.2 09/27/2022   AGRATIO 2.3 (H) 09/27/2022   BILITOT 0.6 07/19/2023   ALKPHOS 81 07/19/2023   AST 24 07/19/2023   ALT 47 07/19/2023   Last lipids Lab Results  Component Value Date   CHOL 190 10/25/2023   HDL 48 10/25/2023   LDLCALC 124 (H) 10/25/2023   TRIG 83 10/25/2023   CHOLHDL 4.0 10/25/2023   Last hemoglobin A1c No results found for: HGBA1C Last thyroid functions Lab Results  Component Value Date   TSH 1.87 07/19/2023   Last vitamin D  Lab Results  Component Value Date   VD25OH 87 10/25/2023   Last vitamin B12 and Folate Lab Results  Component Value Date   VITAMINB12 582 10/25/2023   FOLATE 8.3 07/19/2023      The ASCVD Risk score (Arnett DK, et al., 2019) failed to calculate for the following reasons:   The 2019 ASCVD risk score is only valid for ages 67 to 66   Risk score cannot be calculated because patient has a medical history suggesting prior/existing ASCVD  Assessment & Plan:  MS (multiple sclerosis) (HCC)  Other fatigue  Seborrheic keratoses  Assessment and Plan    MS Managed by neurology, pt doing well overall on injections  Seborrheic keratosis Seborrheic keratosis causing discomfort. Shave biopsy recommended over freezing due to size. - Pt will schedule follow up exam for surgical excision via shave bx.  Fatigue Multifactorial. Doing much better since supplementation of Vit D and b12.         Return in about 3 months (around 02/01/2024).   Nathaniel LITTIE Gave, PA

## 2023-11-01 NOTE — Patient Instructions (Signed)
 Please schedule a 3 month follow up; will need 40 min for shave biopsy.

## 2023-11-12 DIAGNOSIS — G35 Multiple sclerosis: Secondary | ICD-10-CM | POA: Diagnosis not present

## 2023-11-26 ENCOUNTER — Ambulatory Visit: Admitting: Urgent Care

## 2023-12-04 ENCOUNTER — Encounter: Payer: Self-pay | Admitting: Urgent Care

## 2023-12-04 DIAGNOSIS — R5383 Other fatigue: Secondary | ICD-10-CM

## 2023-12-04 DIAGNOSIS — G35 Multiple sclerosis: Secondary | ICD-10-CM

## 2024-01-02 ENCOUNTER — Other Ambulatory Visit: Payer: Self-pay | Admitting: Neurology

## 2024-01-02 MED ORDER — AMPHETAMINE-DEXTROAMPHET ER 30 MG PO CP24
30.0000 mg | ORAL_CAPSULE | Freq: Every morning | ORAL | 0 refills | Status: DC
Start: 2024-01-02 — End: 2024-03-02

## 2024-01-02 MED ORDER — AMPHETAMINE-DEXTROAMPHETAMINE 30 MG PO TABS
30.0000 mg | ORAL_TABLET | Freq: Every day | ORAL | 0 refills | Status: DC
Start: 2024-01-02 — End: 2024-02-18

## 2024-01-02 NOTE — Telephone Encounter (Signed)
 Requesting rx rf of  Adderal XR 30mg   Last written 11/22/2023 And  Adderall 30mg  IR Last written 11/22/2023 Last OV 11/01/2023 Upcoming appt 02/07/2024

## 2024-02-07 ENCOUNTER — Encounter: Payer: Self-pay | Admitting: Urgent Care

## 2024-02-07 ENCOUNTER — Ambulatory Visit: Admitting: Urgent Care

## 2024-02-07 ENCOUNTER — Other Ambulatory Visit: Payer: Self-pay | Admitting: Urgent Care

## 2024-02-07 VITALS — BP 117/75 | HR 73 | Ht 67.0 in | Wt 166.0 lb

## 2024-02-07 DIAGNOSIS — L918 Other hypertrophic disorders of the skin: Secondary | ICD-10-CM | POA: Diagnosis not present

## 2024-02-07 NOTE — Progress Notes (Signed)
 Established Patient Office Visit  Subjective:  Patient ID: Nathaniel Schweikert., male    DOB: 1985-08-09  Age: 38 y.o. MRN: [id number]  Chief Complaint  Patient presents with   mole removal    HPI  Pt presents today for removal of suspected skin tag, located on R lower back. Reason for removal - gets caught up on pants and underwear, causing irritation.  Patient Active Problem List   Diagnosis Date Noted   High risk medication use 09/27/2022   Gait disturbance 09/27/2022   Numbness 09/27/2022   Slurred speech 09/27/2022   Multiple sclerosis 04/28/2018   Past Medical History:  Diagnosis Date   ADD (attention deficit disorder)    Hepatitis B    Multiple sclerosis 04/28/2018   Neuromuscular disorder (HCC) 2019   Multiple Sclerosis   Stroke [facility]) 2019   Had a stroke like symptom in 2017 that ended up being MS with a CT scan after hitting head at work in 2019.   Vision abnormalities    Past Surgical History:  Procedure Laterality Date   orif, left arm     Social History   Tobacco Use   Smoking status: Every Day    Types: Cigarettes   Smokeless tobacco: Never   Tobacco comments:    4 cigarettes per day  Vaping Use   Vaping status: Never Used  Substance Use Topics   Alcohol use: Not Currently   Drug use: Never      ROS: as noted in HPI  Objective:     BP 117/75   Pulse 73   Ht 5' 7 (1.702 m)   Wt 166 lb (75.3 kg)   SpO2 99%   BMI 26.00 kg/m  BP Readings from Last 3 Encounters:  02/07/24 117/75  11/01/23 126/87  08/23/23 128/86   Wt Readings from Last 3 Encounters:  02/07/24 166 lb (75.3 kg)  11/01/23 168 lb 4 oz (76.3 kg)  08/23/23 167 lb (75.8 kg)      Physical Exam Vitals and nursing note reviewed. Exam conducted with a chaperone present.  Constitutional:      Appearance: Normal appearance. He is normal weight.  HENT:     Head: Normocephalic and atraumatic.  Cardiovascular:     Rate and Rhythm: Normal rate.  Pulmonary:     Effort:  Pulmonary effort is normal. No respiratory distress.  Skin:    General: Skin is warm and dry.     Findings: Lesion (acrochordon to R lower back near pant line) present.      Neurological:     Mental Status: He is alert and oriented to person, place, and time.  Psychiatric:        Mood and Affect: Mood normal.        Behavior: Behavior normal.      No results found for any visits on 02/07/24.  Skin excision  Date/Time: 02/07/2024 1:28 PM  Performed by: Lowella Benton CROME, PA Authorized by: Lowella Benton CROME, PA   Number of Lesions: 1 Lesion 1:    Body area: trunk   Trunk location: back   Initial size (mm): 6   Final defect size (mm): 5   Malignancy: benign lesion     Destruction method comment: shave biopsy   Wound repair type: left open to heal by secondary intention.    The ASCVD Risk score (Arnett DK, et al., 2019) failed to calculate for the following reasons:   The 2019 ASCVD risk score is only valid  for ages 19 to 72   Risk score cannot be calculated because patient has a medical history suggesting prior/existing ASCVD  Assessment & Plan:  Acrochordon -     Skin excision -     Surgical pathology -     Shaving of skin growth of body, arms, or legs, 0.5 cm or less  Shave bx performed. Suspect benign lesion, will send for derm path report.  No follow-ups on file.   Benton LITTIE Gave, PA

## 2024-02-07 NOTE — Patient Instructions (Signed)
 Keep the area clean and dry for the next 48 hours. Keep the area covered with a bandaid or telfa pad for the next 24 hours. Apply topical antibiotic ointment.  Monitor for redness, swelling, warmth.  Do not soak in a bath tub, pool or hot tub until one week. You may shower as normal in 24 hours. Blot the area dry.

## 2024-02-11 ENCOUNTER — Ambulatory Visit: Payer: Self-pay | Admitting: Urgent Care

## 2024-02-11 DIAGNOSIS — G35D Multiple sclerosis, unspecified: Secondary | ICD-10-CM

## 2024-02-11 DIAGNOSIS — R5383 Other fatigue: Secondary | ICD-10-CM

## 2024-02-11 LAB — DERMATOLOGY PATHOLOGY

## 2024-02-18 ENCOUNTER — Encounter: Payer: Self-pay | Admitting: Neurology

## 2024-02-18 ENCOUNTER — Ambulatory Visit: Admitting: Neurology

## 2024-02-18 VITALS — BP 129/81 | HR 71 | Ht 67.0 in | Wt 166.5 lb

## 2024-02-18 DIAGNOSIS — R200 Anesthesia of skin: Secondary | ICD-10-CM | POA: Diagnosis not present

## 2024-02-18 DIAGNOSIS — Z79899 Other long term (current) drug therapy: Secondary | ICD-10-CM

## 2024-02-18 DIAGNOSIS — R269 Unspecified abnormalities of gait and mobility: Secondary | ICD-10-CM

## 2024-02-18 DIAGNOSIS — G35A Relapsing-remitting multiple sclerosis: Secondary | ICD-10-CM | POA: Diagnosis not present

## 2024-02-18 MED ORDER — MODAFINIL 200 MG PO TABS: 200.0000 mg | ORAL_TABLET | Freq: Every morning | ORAL | 5 refills | Status: DC

## 2024-02-18 NOTE — Progress Notes (Signed)
 [facility]  PATIENT: Nathaniel Carlson Nathaniel Carlson. DOB: [birth year]  REFERRING DOCTOR OR PCP: Wilbert Croak, NP SOURCE: Patient, notes from primary care, notes from neurology, imaging and lab reports, MRI images personally reviewed.  _________________________________   HISTORICAL  CHIEF COMPLAINT:  Chief Complaint  Patient presents with   Follow-up    Pt in room 10. Alone. Here for MS follow up.    HISTORY OF PRESENT ILLNESS:  Nathaniel Carlson is a 38 y.o. man with relapsing remitting multiple sclerosis.  Update 02/18/2024: He is on Briumvi, last infusion 10/2023 and next is 04/2024.  He has tolerated it well with just minimal infusion reaction x 1 day.    He denies any exacerbation or new MS symptoms  Gait is a mildly off balanced and this worsens as the day goes on.  He uses the bannister on stairs and take just one step at a time.SABRA   He can walk > 1 mile.  Hands are no longer weak.  Mild leg weakness as day goes on   Sometimes he drags is feet .  He has mild left arm dysesthesias now and then but not bad enough to go on a medicaiton.  Bladder function is doing well.   No hesitancy.   He has had ED since the onset of symptoms 08/2022 and is on sildenafil   Vision is stable.  He wears glasses and has new prescription.  He has fatigue that since 4/24, helped by adding modafinil  to the Adderall.   He is sleeping well     He is more forgetful and has done things like left the water running.  He has noted auditory processing and reduced word finding difficulty, better on Adderall/modafinil .   He works in Chemical engineer (office).  The combination works much better than Adderall alone for work Mood is doing well    MS History: He was diagnosed with MS in 2019 who presented to the emergency room March 12, 2018 with a head injury.  A CT scan of the head showed a left frontal abnormal focus and he had an MRI of the brain with and without contrast.  It was consistent with  multiple sclerosis.  The left frontal lobe focus had a tumefactive enhancing appearance.  He was referred to Dr. Beryl Who saw him shortly after the emergency room visit.  He was placed on Gilenya.  He was lost to neurologic follow-up after 2020.   He took Gilenya for about one year but was having numbness so he stopped.  He switched to Briumvi after 2024 MRI with new activity.   In retrospect, he has some other symptoms before his diagnosis in 2019..  At age 36 he had diplopia and visual changes that gradually improved over a few weeks.  In 2017 he had an episode of right-sided numbness and slurred speech with ataxia that lasted only a few hours.   He went to Urgent Care and was advised to go to the ED but opted not to.    He was doing well with his medication until early April 2024.   He was working under a car when he had the onset of slurred speech, hand weakness and clumsiness.   Symptoms were significant and stable for the first 4 or 5 days and then he improved a little but not to baseline.   His fatigue is much worse since then and has not improved.   He had a repeat MRI of the brain on 09/07/2022 that  showed significant progression with many more T2/FLAIR hyperintense foci in the hemispheres compared to the 2020 MRI.  1 large focus in the right frontal lobe enhanced.  2 other smaller enhancing foci are noted in the right frontal lobe also enhanced.       Imaging: MRI of the brain 03/13/2018 showed some scattered T2/FLAIR hypertense foci in the periventricular, juxtacortical and deep white matter of the cerebral hemispheres.  1 focus in the left frontal lobe is acute with enhancement (tumefactive appearance).  Another small focus that enhanced results are noted.  MRI of the cervical spine 04/09/2018 showed a possible subtle focus at the cervicomedullary junction.  The spinal cord is otherwise normal.  No significant degenerative changes.  MRI of the brain 09/07/2022 shows multiple T2/FLAIR  hyperintense foci in the periventricular, juxtacortical and deep white matter.  One of the foci in the right frontal lobe enhances after contrast administration consistent with acute demyelination.  There is a smaller focus nearby in the right frontal lobe that also has subtle enhancement.     MRI of the brain 08/20/2023 showed multiple T2/FLAIR hyperintense foci of the periventricular, juxtacortical and deep white matter of the cerebral hemispheres. An additional focus is noted in the right middle cerebellar peduncle. None of the foci enhance or appear to be acute. 1 focus in the juxtacortical white matter of the left parietal lobe seen on the current MRI was not clearly seen on the MRI from 09/07/2022. Several enhancing foci in 2024 no longer enhance and are reduced in size on the current MRI.   MRI of the cervical spine 08/28/2023 showed a T2 hyperintense focus anteriorly within the spinal cord just below the cervicomedullary junction. It is consistent with a chronic demyelinating plaque associated with multiple sclerosis. It is present on the MRI from 2019 as well. It does not enhance.  No significant degenerative change.  Normal enhancement   REVIEW OF SYSTEMS: Constitutional: No fevers, chills, sweats, or change in appetite Eyes: No visual changes, double vision, eye pain Ear, nose and throat: No hearing loss, ear pain, nasal congestion, sore throat Cardiovascular: No chest pain, palpitations Respiratory:  No shortness of breath at rest or with exertion.   No wheezes GastrointestinaI: No nausea, vomiting, diarrhea, abdominal pain, fecal incontinence Genitourinary:  No dysuria, urinary retention or frequency.  No nocturia. Musculoskeletal:  No neck pain, back pain Integumentary: No rash, pruritus, skin lesions Neurological: as above Psychiatric: No depression at this time.  No anxiety Endocrine: No palpitations, diaphoresis, change in appetite, change in weigh or increased  thirst Hematologic/Lymphatic:  No anemia, purpura, petechiae. Allergic/Immunologic: No itchy/runny eyes, nasal congestion, recent allergic reactions, rashes  ALLERGIES: Allergies  Allergen Reactions   Morphine     Pt said he grandfather passed away from morphine at the hospital. Pt has not never had morphine.     HOME MEDICATIONS:  Current Outpatient Medications:    sildenafil  (VIAGRA ) 100 MG tablet, TAKE 1/2 TO 1 TABLET BY MOUTH EVERY DAY AS NEEDED, Disp: 30 tablet, Rfl: 3   Ublituximab-xiiy (BRIUMVI) 150 MG/6ML SOLN, Inject into the vein every 6 (six) months., Disp: , Rfl:    vitamin B-12 (CYANOCOBALAMIN ) 100 MCG tablet, Take 100 mcg by mouth daily., Disp: , Rfl:    amphetamine -dextroamphetamine  (ADDERALL XR) 30 MG 24 hr capsule, Take 1 capsule (30 mg total) by mouth every morning. (Patient not taking: Reported on 02/18/2024), Disp: 30 capsule, Rfl: 0   modafinil  (PROVIGIL ) 200 MG tablet, Take 1 tablet (200 mg total)  by mouth every morning., Disp: 30 tablet, Rfl: 5  PAST MEDICAL HISTORY: Past Medical History:  Diagnosis Date   ADD (attention deficit disorder)    Hepatitis B    Multiple sclerosis 04/28/2018   Neuromuscular disorder (HCC) 2019   Multiple Sclerosis   Stroke (HCC) 2019   Had a stroke like symptom in 2017 that ended up being MS with a CT scan after hitting head at work in 2019.   Vision abnormalities     PAST SURGICAL HISTORY: Past Surgical History:  Procedure Laterality Date   orif, left arm      FAMILY HISTORY: Family History  Problem Relation Age of Onset   Hypertension Mother    Asthma Mother    Vision loss Mother    Hypertension Brother    ADD / ADHD Brother    Asthma Brother    Vision loss Brother    Vision loss Sister    Multiple sclerosis Neg Hx     SOCIAL HISTORY: Social History   Socioeconomic History   Marital status: Widowed    Spouse name: Not on file   Number of children: 3   Years of education: Not on file   Highest education  level: 12th grade  Occupational History   Not on file  Tobacco Use   Smoking status: Former    Current packs/day: 0.00    Types: Cigarettes    Quit date: 02/2023    Years since quitting: 1.0   Smokeless tobacco: Never   Tobacco comments:    4 cigarettes per day  Vaping Use   Vaping status: Never Used  Substance and Sexual Activity   Alcohol use: Not Currently   Drug use: Never   Sexual activity: Yes    Partners: Female  Other Topics Concern   Not on file  Social History Narrative   ** Merged History Encounter ** ** Merged History Encounter **          Right handed    Wear glasses    Drinks one soda per day, drinks sweet    Pt works    Pt lives with family    Social Drivers of Corporate investment banker Strain: Low Risk  (10/31/2023)   Overall Financial Resource Strain (CARDIA)    Difficulty of Paying Living Expenses: Not hard at all  Food Insecurity: No Food Insecurity (10/31/2023)   Hunger Vital Sign    Worried About Running Out of Food in the Last Year: Never true    Ran Out of Food in the Last Year: Never true  Transportation Needs: No Transportation Needs (10/31/2023)   PRAPARE - Administrator, Civil Service (Medical): No    Lack of Transportation (Non-Medical): No  Physical Activity: Insufficiently Active (10/31/2023)   Exercise Vital Sign    Days of Exercise per Week: 4 days    Minutes of Exercise per Session: 20 min  Stress: No Stress Concern Present (10/31/2023)   [employer] of Occupational Health - Occupational Stress Questionnaire    Feeling of Stress: Not at all  Social Connections: Moderately Isolated (10/31/2023)   Social Connection and Isolation Panel    Frequency of Communication with Friends and Family: Once a week    Frequency of Social Gatherings with Friends and Family: More than three times a week    Attends Religious Services: More than 4 times per year    Active Member of [employer] or Organizations: No    Attends Tax inspector  Meetings: Not on file    Marital Status: Widowed  Intimate Partner Violence: Not on file       PHYSICAL EXAM  Vitals:   02/18/24 1606  BP: 129/81  Pulse: 71  Weight: 166 lb 8 oz (75.5 kg)  Height: 5' 7 (1.702 m)    Body mass index is 26.08 kg/m.  No results found.   General: The patient is well-developed and well-nourished and in no acute distress  HEENT:  Head is [place]/AT.  Sclera are anicteric.   Skin: Extremities are without rash or  edema.  Musculoskeletal:  Back is nontender  Neurologic Exam  Mental status: The patient is alert and oriented x 3 at the time of the examination. The patient has apparent normal recent and remote memory, with an apparently normal attention span and concentration ability.   Speech is normal.  Cranial nerves: Extraocular movements are full.  . There is good facial sensation to soft touch bilaterally.Facial strength is normal.  Trapezius and sternocleidomastoid strength is normal.  No obvious hearing deficits are noted.  Motor:  Muscle bulk is normal.   Tone is normal. Strength is  5 / 5 in all 4 extremities except 4+/5 strength in the interossei muscles of the hands.   Sensory: Sensory testing is intact to pinprick, soft touch and vibration sensation in all 4 extremities.  Coordination: Cerebellar testing reveals good finger-nose-finger and slightly reduced heel-to-shin bilaterally.  Gait and station: Station is normal.   The gait is near normal.  Tandem gait is wide.  Romberg is negative.   Reflexes: Deep tendon reflexes are symmetric and normal in the arms but increase in the legs with crossed abductors at the knees.  No ankle clonus noted..   Plantar responses are flexor.    DIAGNOSTIC DATA (LABS, IMAGING, TESTING) - I reviewed patient records, labs, notes, testing and imaging myself where available.  Lab Results  Component Value Date   WBC 14.7 (H) 08/13/2023   HGB 16.3 08/13/2023   HCT 47.5 08/13/2023   MCV 92  08/13/2023   PLT 299 08/13/2023      Component Value Date/Time   NA 141 07/19/2023 0756   NA 141 09/27/2022 1458   K 4.0 07/19/2023 0756   CL 104 07/19/2023 0756   CO2 29 07/19/2023 0756   GLUCOSE 54 (L) 07/19/2023 0756   BUN 13 07/19/2023 0756   BUN 10 09/27/2022 1458   CREATININE 0.89 07/19/2023 0756   CALCIUM 9.7 07/19/2023 0756   PROT 7.3 07/19/2023 0756   PROT 7.2 09/27/2022 1458   ALBUMIN 4.9 07/19/2023 0756   ALBUMIN 5.0 09/27/2022 1458   AST 24 07/19/2023 0756   ALT 47 07/19/2023 0756   ALKPHOS 81 07/19/2023 0756   BILITOT 0.6 07/19/2023 0756   BILITOT 0.7 09/27/2022 1458   GFRNONAA 98 05/19/2018 1449   GFRAA 113 05/19/2018 1449    Lab Results  Component Value Date   TSH 1.87 07/19/2023       ASSESSMENT AND PLAN  Multiple sclerosis, relapsing-remitting - Plan: IgG, IgA, IgM, CBC with Differential/Platelet  High risk medication use - Plan: IgG, IgA, IgM, CBC with Differential/Platelet  Gait disturbance  Numbness  Continue Briumvi.     Check labs Continue Adderall/Modafinil  for MS related ADD and fatigue.  He notes improved job performance while on these medications. Stay active and exercise. Rtc 6 months, sooner if new or worsening issues  Khyle Goodell A. Vear, MD, Teola RENO 02/18/2024, 6:03 PM Certified in Neurology, Clinical Neurophysiology, Sleep  Medicine and Neuroimaging  [facility] [address], [address] [place], [place] [address] [phone]

## 2024-02-19 ENCOUNTER — Ambulatory Visit: Payer: Self-pay | Admitting: Neurology

## 2024-02-19 LAB — CBC WITH DIFFERENTIAL/PLATELET
Basophils Absolute: 0.1 x10E3/uL (ref 0.0–0.2)
Basos: 1 %
EOS (ABSOLUTE): 0.1 x10E3/uL (ref 0.0–0.4)
Eos: 1 %
Hematocrit: 47.8 % (ref 37.5–51.0)
Hemoglobin: 16.2 g/dL (ref 13.0–17.7)
Immature Grans (Abs): 0 x10E3/uL (ref 0.0–0.1)
Immature Granulocytes: 0 %
Lymphocytes Absolute: 1.4 x10E3/uL (ref 0.7–3.1)
Lymphs: 17 %
MCH: 31.5 pg (ref 26.6–33.0)
MCHC: 33.9 g/dL (ref 31.5–35.7)
MCV: 93 fL (ref 79–97)
Monocytes Absolute: 0.5 x10E3/uL (ref 0.1–0.9)
Monocytes: 6 %
Neutrophils Absolute: 6.4 x10E3/uL (ref 1.4–7.0)
Neutrophils: 75 %
Platelets: 259 x10E3/uL (ref 150–450)
RBC: 5.15 x10E6/uL (ref 4.14–5.80)
RDW: 13.2 % (ref 11.6–15.4)
WBC: 8.5 x10E3/uL (ref 3.4–10.8)

## 2024-02-19 LAB — IGG, IGA, IGM
IgA/Immunoglobulin A, Serum: 155 mg/dL (ref 90–386)
IgG (Immunoglobin G), Serum: 824 mg/dL (ref 603–1613)
IgM (Immunoglobulin M), Srm: 90 mg/dL (ref 20–172)

## 2024-03-03 MED ORDER — AMPHETAMINE-DEXTROAMPHET ER 30 MG PO CP24: 30.0000 mg | ORAL_CAPSULE | Freq: Every morning | ORAL | 0 refills | Status: DC

## 2024-03-03 MED ORDER — AMPHETAMINE-DEXTROAMPHETAMINE 30 MG PO TABS: 30.0000 mg | ORAL_TABLET | Freq: Every day | ORAL | 0 refills | Status: DC

## 2024-03-24 ENCOUNTER — Telehealth: Payer: Self-pay | Admitting: Neurology

## 2024-03-24 NOTE — Telephone Encounter (Signed)
 Nathaniel Carlson with BRIUMVI called to speak to Nurse about Pt medication  need updates Nathaniel Carlson states  she need  missing ICD  10 coded on medication order for Pt. As of 10-1- 25  all  ICD 10  has changes for MS ,  [phone]

## 2024-03-24 NOTE — Telephone Encounter (Signed)
 Spoke w/ Stacy/[facility]. She will fax form for us  to complete today and we will fax back.

## 2024-03-24 NOTE — Telephone Encounter (Signed)
 New ICD 10: G35.A/RRMS

## 2024-04-14 NOTE — Addendum Note (Signed)
 Addended by: BONNY JON DEL on: 04/14/2024 08:59 AM   Modules accepted: Orders

## 2024-04-15 MED ORDER — AMPHETAMINE-DEXTROAMPHETAMINE 30 MG PO TABS: 30.0000 mg | ORAL_TABLET | Freq: Every day | ORAL | 0 refills | Status: DC

## 2024-04-15 MED ORDER — AMPHETAMINE-DEXTROAMPHET ER 30 MG PO CP24: 30.0000 mg | ORAL_CAPSULE | Freq: Every morning | ORAL | 0 refills | Status: DC

## 2024-04-15 NOTE — Addendum Note (Signed)
 Addended by: Mariena Meares on: 04/15/2024 05:48 AM   Modules accepted: Orders

## 2024-04-28 DIAGNOSIS — G35A Relapsing-remitting multiple sclerosis: Secondary | ICD-10-CM | POA: Diagnosis not present

## 2024-05-17 ENCOUNTER — Encounter: Payer: Self-pay | Admitting: Urgent Care

## 2024-05-17 DIAGNOSIS — R5383 Other fatigue: Secondary | ICD-10-CM

## 2024-05-17 DIAGNOSIS — G35D Multiple sclerosis, unspecified: Secondary | ICD-10-CM

## 2024-05-18 MED ORDER — AMPHETAMINE-DEXTROAMPHET ER 30 MG PO CP24: 30.0000 mg | ORAL_CAPSULE | Freq: Every morning | ORAL | 0 refills | Status: AC

## 2024-05-18 MED ORDER — AMPHETAMINE-DEXTROAMPHETAMINE 30 MG PO TABS: 30.0000 mg | ORAL_TABLET | Freq: Every day | ORAL | 0 refills | Status: AC

## 2024-05-18 NOTE — Telephone Encounter (Signed)
 Requesting rx rf of Addearll XR 30mg   Last written 04/15/2024 And  Adderall 30mg  IR  Last written 04/15/2024 Last OV 02/07/2024 Upcoming appt 08/17/2024

## 2024-05-19 ENCOUNTER — Encounter: Payer: Self-pay | Admitting: Neurology

## 2024-05-19 MED ORDER — MODAFINIL 200 MG PO TABS: 200.0000 mg | ORAL_TABLET | Freq: Every morning | ORAL | 5 refills | Status: AC

## 2024-05-19 MED ORDER — SILDENAFIL CITRATE 100 MG PO TABS: ORAL_TABLET | ORAL | 3 refills | Status: AC

## 2024-05-19 NOTE — Telephone Encounter (Signed)
 Pt requesting pharmacy change due to insurance change for the year. Rx pended for approval. Modafinil  last filled 04/22/24 30day.

## 2024-05-26 ENCOUNTER — Other Ambulatory Visit (HOSPITAL_COMMUNITY): Payer: Self-pay

## 2024-05-26 ENCOUNTER — Telehealth: Payer: Self-pay

## 2024-05-26 NOTE — Telephone Encounter (Signed)
 Pharmacy Patient Advocate Encounter  Received notification from [facility] that Prior Authorization for Modafinil  has been APPROVED from 05/26/2024 to 05/26/2025. Ran test claim, Copay is $10.00. This test claim was processed through [facility]- copay amounts may vary at other pharmacies due to pharmacy/plan contracts, or as the patient moves through the different stages of their insurance plan.   PA #/Case ID/Reference #: [id number]

## 2024-06-19 ENCOUNTER — Ambulatory Visit: Admitting: Urgent Care

## 2024-06-19 ENCOUNTER — Ambulatory Visit (HOSPITAL_BASED_OUTPATIENT_CLINIC_OR_DEPARTMENT_OTHER)

## 2024-06-19 ENCOUNTER — Encounter (HOSPITAL_BASED_OUTPATIENT_CLINIC_OR_DEPARTMENT_OTHER): Payer: Self-pay

## 2024-06-19 ENCOUNTER — Encounter: Payer: Self-pay | Admitting: Urgent Care

## 2024-06-19 VITALS — BP 133/79 | HR 84 | Ht 67.0 in | Wt 163.0 lb

## 2024-06-19 DIAGNOSIS — R1031 Right lower quadrant pain: Secondary | ICD-10-CM

## 2024-06-19 DIAGNOSIS — R198 Other specified symptoms and signs involving the digestive system and abdomen: Secondary | ICD-10-CM

## 2024-06-19 LAB — HEMOCCULT GUIAC POC 1CARD (OFFICE): Fecal Occult Blood, POC: NEGATIVE

## 2024-06-19 NOTE — Progress Notes (Signed)
 "  Established Patient Office Visit  Subjective:  Patient ID: Nathaniel Kuenzi., male    DOB: 06-08-1985  Age: 39 y.o. MRN: [id number]  Chief Complaint  Patient presents with   Abdominal Pain    Dark stools, constipation x 1 week.     Abdominal Pain    Discussed the use of AI scribe software for clinical note transcription with the patient, who gave verbal consent to proceed.  History of Present Illness   Nathaniel Carlson is a 39 year old male who presents with abdominal pain and changes in stool characteristics.  He began experiencing symptoms last Friday, starting with dark stools and abdominal pain. The pain initially localized around the umbilicus and radiated to the side of his back. He describes the pain as stabbing and severe, particularly at night between 7 to 9 PM, lasting throughout the night, and disrupting his sleep. He finds some relief by elevating his legs and using a heat pad.  The pain was so intense that he stopped eating for two days, as eating seemed to exacerbate the pain, especially after dinner. He attempted to use Tums and ibuprofen for relief, but these provided only temporary relief. He also applied Tiger Balm and used a heat pad, which helped alleviate some of the discomfort.  Initially, his stools were dark and sticky, but they have since returned to a normal brown color, although he noticed a film on them, which he describes as mucus-like. No nausea, vomiting, or urinary symptoms. He reports normal urination and no bloating or distension, although his girlfriend noticed some asymmetry in his abdomen, with one side appearing bloated.  He has not taken any new medications or supplements recently. No fever or severe nausea, and his appetite has returned to normal after the initial days of pain. No significant changes in eating habits or physical activity.      Patient Active Problem List   Diagnosis Date Noted   High risk medication use 09/27/2022    Gait disturbance 09/27/2022   Numbness 09/27/2022   Slurred speech 09/27/2022   Multiple sclerosis 04/28/2018   Past Medical History:  Diagnosis Date   ADD (attention deficit disorder)    Hepatitis B    Multiple sclerosis 04/28/2018   Neuromuscular disorder (HCC) 2019   Multiple Sclerosis   Stroke [facility]) 2019   Had a stroke like symptom in 2017 that ended up being MS with a CT scan after hitting head at work in 2019.   Vision abnormalities    Past Surgical History:  Procedure Laterality Date   orif, left arm     Social History[1]    ROS: as noted in HPI  Objective:     BP 133/79   Pulse 84   Ht 5' 7 (1.702 m)   Wt 163 lb (73.9 kg)   SpO2 99%   BMI 25.53 kg/m  BP Readings from Last 3 Encounters:  06/19/24 133/79  02/18/24 129/81  02/07/24 117/75   Wt Readings from Last 3 Encounters:  06/19/24 163 lb (73.9 kg)  02/18/24 166 lb 8 oz (75.5 kg)  02/07/24 166 lb (75.3 kg)      Physical Exam Vitals and nursing note reviewed.  Constitutional:      General: He is not in acute distress.    Appearance: He is well-developed. He is not ill-appearing, toxic-appearing or diaphoretic.  HENT:     Head: Normocephalic and atraumatic.  Cardiovascular:     Rate and Rhythm: Normal  rate and regular rhythm.  Pulmonary:     Effort: Pulmonary effort is normal. No respiratory distress.     Breath sounds: Normal breath sounds. No stridor. No wheezing or rhonchi.  Abdominal:     General: Abdomen is flat. Bowel sounds are decreased. There is no distension. There are no signs of injury.     Palpations: Abdomen is soft. There is no shifting dullness, hepatomegaly, splenomegaly or mass.     Tenderness: There is abdominal tenderness in the right lower quadrant. There is no guarding or rebound. Negative signs include Murphy's sign.     Comments: Decreased bowel sounds to LUQ and severely diminished to LLQ Minimal pain to deep palpation of RLQ  Genitourinary:    Rectum: Guaiac result  negative. No mass or tenderness.  Skin:    General: Skin is warm and dry.     Coloration: Skin is not cyanotic, mottled or pale.     Findings: No rash.  Neurological:     General: No focal deficit present.     Mental Status: He is alert.      Results for orders placed or performed in visit on 06/19/24  POCT Occult Blood Stool  Result Value Ref Range   Fecal Occult Blood, POC Negative Negative   Card #1 Date     Card #2 Fecal Occult Blod, POC     Card #2 Date     Card #3 Fecal Occult Blood, POC     Card #3 Date        The ASCVD Risk score (Arnett DK, et al., 2019) failed to calculate for the following reasons:   Risk score cannot be calculated because patient has a medical history suggesting prior/existing ASCVD   * - Cholesterol units were assumed  Assessment & Plan:  Abdominal pain, RLQ -     CT ABDOMEN PELVIS W CONTRAST; Future  Right lower quadrant abdominal pain -     CT ABDOMEN PELVIS W CONTRAST; Future  Change in bowel movement -     POCT occult blood stool   Assessment and Plan    Abdominal pain, rule out colitis or intussusception Intermittent severe nocturnal abdominal pain with stool changes. Differential includes intussusception and colitis. Imaging necessary due to stool changes. - Ordered CT scan of the abdomen with contrast. - Ordered stool tests for blood, negative in office.       No follow-ups on file.   Benton LITTIE Gave, PA    [1]  Social History Tobacco Use   Smoking status: Former    Current packs/day: 0.00    Types: Cigarettes    Quit date: 02/2023    Years since quitting: 1.3   Smokeless tobacco: Never   Tobacco comments:    4 cigarettes per day  Vaping Use   Vaping status: Never Used  Substance Use Topics   Alcohol use: Not Currently   Drug use: Never   "

## 2024-06-19 NOTE — Patient Instructions (Signed)
 Please obtain CT scan of your abdomen

## 2024-08-17 ENCOUNTER — Ambulatory Visit: Admitting: Neurology
